# Patient Record
Sex: Male | Born: 1985 | Race: White | Hispanic: No | Smoking: Never smoker
Health system: Southern US, Community
[De-identification: ages and names within clinical notes are randomized; demographics above are authoritative.]

## PROBLEM LIST (undated history)

## (undated) DIAGNOSIS — E785 Hyperlipidemia, unspecified: Secondary | ICD-10-CM

## (undated) DIAGNOSIS — H548 Legal blindness, as defined in USA: Secondary | ICD-10-CM

## (undated) DIAGNOSIS — E1029 Type 1 diabetes mellitus with other diabetic kidney complication: Secondary | ICD-10-CM

## (undated) DIAGNOSIS — E10319 Type 1 diabetes mellitus with unspecified diabetic retinopathy without macular edema: Secondary | ICD-10-CM

## (undated) DIAGNOSIS — R809 Proteinuria, unspecified: Secondary | ICD-10-CM

## (undated) DIAGNOSIS — I10 Essential (primary) hypertension: Secondary | ICD-10-CM

## (undated) DIAGNOSIS — E109 Type 1 diabetes mellitus without complications: Secondary | ICD-10-CM

---

## 2009-03-20 DIAGNOSIS — A419 Sepsis, unspecified organism: Secondary | ICD-10-CM

## 2009-03-20 HISTORY — DX: Sepsis, unspecified organism: A41.9

## 2009-03-20 HISTORY — PX: DENTAL SURGERY: SHX609

## 2019-08-26 ENCOUNTER — Other Ambulatory Visit: Payer: Self-pay | Admitting: Orthopedic Surgery

## 2019-08-26 DIAGNOSIS — M25512 Pain in left shoulder: Secondary | ICD-10-CM

## 2019-09-09 ENCOUNTER — Ambulatory Visit
Admission: RE | Admit: 2019-09-09 | Discharge: 2019-09-09 | Disposition: A | Discharge status: Home / Self Care | Payer: Medicaid Other | Source: Ambulatory Visit | Attending: Orthopedic Surgery | Admitting: Orthopedic Surgery

## 2019-09-09 ENCOUNTER — Other Ambulatory Visit: Payer: Self-pay

## 2019-09-09 DIAGNOSIS — R937 Abnormal findings on diagnostic imaging of other parts of musculoskeletal system: Secondary | ICD-10-CM | POA: Insufficient documentation

## 2019-09-09 DIAGNOSIS — M25512 Pain in left shoulder: Secondary | ICD-10-CM

## 2019-09-09 MED ORDER — IOHEXOL 180 MG/ML  SOLN
20.0000 mL | Freq: Once | INTRAMUSCULAR | Status: AC
  Administered 2019-09-09: 5 mL via INTRA_ARTICULAR

## 2019-09-09 MED ORDER — SODIUM CHLORIDE (PF) 0.9 % IJ SOLN
20.0000 mL | Freq: Once | INTRAMUSCULAR | Status: AC
  Administered 2019-09-09: 7 mL

## 2019-09-09 MED ORDER — GADOBENATE DIMEGLUMINE 529 MG/ML IV SOLN
1.0000 mL | Freq: Once | INTRAVENOUS | Status: AC
  Administered 2019-09-09: 0.1 mL via INTRA_ARTICULAR

## 2019-09-09 MED ORDER — LIDOCAINE HCL (PF) 1 % IJ SOLN
10.0000 mL | Freq: Once | INTRAMUSCULAR | Status: AC
  Administered 2019-09-09: 10 mL
  Filled 2019-09-09: qty 10

## 2020-03-03 ENCOUNTER — Other Ambulatory Visit: Payer: Self-pay | Admitting: Nephrology

## 2020-03-03 DIAGNOSIS — N183 Chronic kidney disease, stage 3 unspecified: Secondary | ICD-10-CM

## 2020-03-03 DIAGNOSIS — I10 Essential (primary) hypertension: Secondary | ICD-10-CM

## 2020-03-03 DIAGNOSIS — R809 Proteinuria, unspecified: Secondary | ICD-10-CM

## 2020-03-11 ENCOUNTER — Other Ambulatory Visit: Payer: Self-pay

## 2020-03-11 ENCOUNTER — Ambulatory Visit
Admission: RE | Admit: 2020-03-11 | Discharge: 2020-03-11 | Disposition: A | Discharge status: Home / Self Care | Payer: Medicaid Other | Source: Ambulatory Visit | Attending: Nephrology | Admitting: Nephrology

## 2020-03-11 DIAGNOSIS — I10 Essential (primary) hypertension: Secondary | ICD-10-CM | POA: Insufficient documentation

## 2020-03-11 DIAGNOSIS — E1122 Type 2 diabetes mellitus with diabetic chronic kidney disease: Secondary | ICD-10-CM | POA: Diagnosis present

## 2020-03-11 DIAGNOSIS — R809 Proteinuria, unspecified: Secondary | ICD-10-CM | POA: Diagnosis present

## 2020-03-11 DIAGNOSIS — N183 Chronic kidney disease, stage 3 unspecified: Secondary | ICD-10-CM | POA: Diagnosis present

## 2020-05-21 ENCOUNTER — Other Ambulatory Visit: Payer: Self-pay | Admitting: Orthopedic Surgery

## 2020-05-21 ENCOUNTER — Encounter
Admission: RE | Admit: 2020-05-21 | Discharge: 2020-05-21 | Disposition: A | Discharge status: Home / Self Care | Payer: Medicaid Other | Source: Ambulatory Visit | Attending: Orthopedic Surgery | Admitting: Orthopedic Surgery

## 2020-05-21 ENCOUNTER — Other Ambulatory Visit: Payer: Self-pay

## 2020-05-21 HISTORY — DX: Proteinuria, unspecified: R80.9

## 2020-05-21 HISTORY — DX: Legal blindness, as defined in USA: H54.8

## 2020-05-21 HISTORY — DX: Essential (primary) hypertension: I10

## 2020-05-21 HISTORY — DX: Hyperlipidemia, unspecified: E78.5

## 2020-05-21 HISTORY — DX: Type 1 diabetes mellitus without complications: E10.9

## 2020-05-21 HISTORY — DX: Type 1 diabetes mellitus with unspecified diabetic retinopathy without macular edema: E10.319

## 2020-05-21 HISTORY — DX: Type 1 diabetes mellitus with other diabetic kidney complication: E10.29

## 2020-05-21 NOTE — Progress Notes (Signed)
Medical clearance form faxed to Dr. Sampson Goon per Dr. Pernell Dupre (anesthesia) request prior to surgery.

## 2020-05-21 NOTE — Patient Instructions (Addendum)
Your procedure is scheduled on:  Tuesday, March 15 Report to the Registration Desk on the 1st floor of the [facility] To find out your arrival time, please call [phone] between 1PM - 3PM on: Monday, March 14  REMEMBER: Instructions that are not followed completely may result in serious medical risk, up to and including death; or upon the discretion of your surgeon and anesthesiologist your surgery may need to be rescheduled.  Do not eat food after midnight the night before surgery.  No gum chewing, lozengers or hard candies.  You may however, drink water up to 2 hours before you are scheduled to arrive for your surgery. Do not drink anything within 2 hours of your scheduled arrival time.  DO NOT TAKE ANY MEDICATIONS THE MORNING OF SURGERY   Keep your insulin pump on until you are instructed otherwise on the day of surgery.  One week prior to surgery: STARTING MARCH 8 Stop aspirin, Anti-inflammatories (NSAIDS) such as Advil, Aleve, Ibuprofen, Motrin, Naproxen, Naprosyn and Aspirin based products such as Excedrin, Goodys Powder, BC Powder. Stop ANY OVER THE COUNTER supplements until after surgery. Stop Magnesium  No Alcohol for 24 hours before or after surgery.  No Smoking including e-cigarettes for 24 hours prior to surgery.  No chewable tobacco products for at least 6 hours prior to surgery.  No nicotine patches on the day of surgery.  Do not use any "recreational" drugs for at least a week prior to your surgery.  Please be advised that the combination of cocaine and anesthesia may have negative outcomes, up to and including death. If you test positive for cocaine, your surgery will be cancelled.  On the morning of surgery brush your teeth with toothpaste and water, you may rinse your mouth with mouthwash if you wish. Do not swallow any toothpaste or mouthwash.  Do not wear jewelry, make-up, hairpins, clips or nail polish.  Do not wear lotions, powders, or perfumes.    Do not shave body from the neck down 48 hours prior to surgery just in case you cut yourself which could leave a site for infection.  Also, freshly shaved skin may become irritated if using the CHG soap.  Contact lenses, hearing aids and dentures may not be worn into surgery.  Do not bring valuables to the hospital. [facility] is not responsible for any missing/lost belongings or valuables.   Use CHG Soap as directed on instruction sheet.  Notify your doctor if there is any change in your medical condition (cold, fever, infection).  Wear comfortable clothing (specific to your surgery type) to the hospital.  Plan for stool softeners for home use; pain medications have a tendency to cause constipation. You can also help prevent constipation by eating foods high in fiber such as fruits and vegetables and drinking plenty of fluids as your diet allows.  After surgery, you can help prevent lung complications by doing breathing exercises.  Take deep breaths and cough every 1-2 hours. Your doctor may order a device called an Incentive Spirometer to help you take deep breaths.  If you are being discharged the day of surgery, you will not be allowed to drive home. You will need a responsible adult (18 years or older) to drive you home and stay with you that night.   If you are taking public transportation, you will need to have a responsible adult (18 years or older) with you. Please confirm with your physician that it is acceptable to use public transportation.  Please call the Pre-admissions Testing Dept. at [phone] if you have any questions about these instructions.  Surgery Visitation Policy:  Patients undergoing a surgery or procedure may have one family member or support person with them as long as that person is not COVID-19 positive or experiencing its symptoms.  That person may remain in the waiting area during the procedure.

## 2020-05-24 ENCOUNTER — Other Ambulatory Visit: Payer: Self-pay

## 2020-05-24 ENCOUNTER — Encounter
Admission: RE | Admit: 2020-05-24 | Discharge: 2020-05-24 | Disposition: A | Discharge status: Home / Self Care | Payer: Medicaid Other | Source: Ambulatory Visit | Attending: Orthopedic Surgery | Admitting: Orthopedic Surgery

## 2020-05-24 DIAGNOSIS — Z01812 Encounter for preprocedural laboratory examination: Secondary | ICD-10-CM | POA: Diagnosis not present

## 2020-05-24 LAB — BASIC METABOLIC PANEL
Anion gap: 8 (ref 5–15)
BUN: 31 mg/dL — ABNORMAL HIGH (ref 6–20)
CO2: 25 mmol/L (ref 22–32)
Calcium: 9.1 mg/dL (ref 8.9–10.3)
Chloride: 104 mmol/L (ref 98–111)
Creatinine, Ser: 1.41 mg/dL — ABNORMAL HIGH (ref 0.61–1.24)
GFR, Estimated: >60 mL/min (ref >60)
Glucose, Bld: 296 mg/dL — ABNORMAL HIGH (ref 70–99)
Potassium: 5.5 mmol/L — ABNORMAL HIGH (ref 3.5–5.1)
Sodium: 137 mmol/L (ref 135–145)

## 2020-05-24 LAB — CBC WITH DIFFERENTIAL/PLATELET
Abs Immature Granulocytes: 0.05 10*3/uL (ref 0.00–0.07)
Basophils Absolute: 0 10*3/uL (ref 0.0–0.1)
Basophils Relative: 1 %
Eosinophils Absolute: 0.2 10*3/uL (ref 0.0–0.5)
Eosinophils Relative: 2 %
HCT: 37.6 % — ABNORMAL LOW (ref 39.0–52.0)
Hemoglobin: 13 g/dL (ref 13.0–17.0)
Immature Granulocytes: 1 %
Lymphocytes Relative: 15 %
Lymphs Abs: 1 10*3/uL (ref 0.7–4.0)
MCH: 29.6 pg (ref 26.0–34.0)
MCHC: 34.6 g/dL (ref 30.0–36.0)
MCV: 85.6 fL (ref 80.0–100.0)
Monocytes Absolute: 0.4 10*3/uL (ref 0.1–1.0)
Monocytes Relative: 6 %
Neutro Abs: 5.2 10*3/uL (ref 1.7–7.7)
Neutrophils Relative %: 75 %
Platelets: 348 10*3/uL (ref 150–400)
RBC: 4.39 MIL/uL (ref 4.22–5.81)
RDW: 12.3 % (ref 11.5–15.5)
WBC: 7 10*3/uL (ref 4.0–10.5)
nRBC: 0 % (ref 0.0–0.2)

## 2020-05-24 LAB — APTT: aPTT: 36 seconds (ref 24–36)

## 2020-05-24 LAB — PROTIME-INR
INR: 1 (ref 0.8–1.2)
Prothrombin Time: 12.4 seconds (ref 11.4–15.2)

## 2020-05-24 NOTE — Pre-Procedure Instructions (Signed)
Progress Notes - documented in this encounter  Lorain Childes, MD - 04/08/2020 3:00 PM EST Formatting of this note is different from the original. Images from the original note were not included. Follow Up Visit   Patient Name: Kenneth Rodgers, male  Patient DOB: [birth year] Date of Service: 04/08/2020  Patient MRN: [id number] Provider Creating Note: Lorain Childes, MD  [id number] Primary Care Physician:  [phone] [name] Additional Physicians/ Providers:   History of Present Illness Kenneth Rodgers is a 35 y.o. Kenneth Rodgers comes to the office for renal follow-up. 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 Renal indices are close to normal limits. He has been on losartan at 25 mg daily and has been tolerating well. Patient has been watching his diet and his hemoglobin A1c has been improving recently.  Medications   Current Outpatient Medications:  . ascorbic acid (VITAMIN C) 500 MG tablet, Take 1,000 mg by mouth daily, Disp: , Rfl:  . atorvastatin (LIPITOR) 10 MG tablet, atorvastatin 10 mg tablet, Disp: , Rfl:  . cetirizine (ZyrTEC) 10 MG tablet, Take 10 mg by mouth daily, Disp: , Rfl:  . gabapentin (NEURONTIN) 300 MG capsule, gabapentin 300 mg capsule, Disp: , Rfl:  . insulin glargine (Lantus SoloStar) 100 UNIT/ML injection, Lantus Solostar U-100 Insulin 100 unit/mL (3 mL) subcutaneous pen, Disp: , Rfl:  . Insulin Lispro, 1 Unit Dial, 100 UNIT/ML solution pen-injector, Humalog KwikPen (U-100) Insulin 100 unit/mL subcutaneous, Disp: , Rfl:  . losartan (COZAAR) 25 MG tablet, losartan 25 mg tablet, Disp: , Rfl:  . meloxicam (MOBIC) 15 MG tablet, , Disp: , Rfl:  . methocarbamol (ROBAXIN) 500 MG tablet, methocarbamol 500 mg tablet TAKE 1 TABLET 3 TIMES A DAY BY ORAL ROUTE FOR 30 DAYS., Disp: , Rfl:   Allergies Patient has no known allergies.  Problem List Patient Active Problem List  Diagnosis  . Diabetes mellitus without mention of  complication, type II or unspecified type, not stated as uncontrolled (HCC)  . Essential hypertension  . Nephrotic syndrome   Review of Systems  Constitutional: Negative.  HENT: Negative.  Eyes: Negative.  Respiratory: Negative.  Cardiovascular: Negative.  Gastrointestinal: Negative.  Genitourinary: Negative.  Musculoskeletal: Negative.  Skin: Negative.  Neurological: Negative.  Endo/Heme/Allergies: Negative.    History Past Medical History:  Diagnosis Date  . Diabetes mellitus without mention of complication, type II or unspecified type, not stated as uncontrolled (HCC)  . Essential hypertension  . Nephrotic syndrome  . Peripheral neuropathy   History reviewed. No pertinent surgical history. Family History  Problem Relation Age of Onset  . Hypertension Mother  . Hyperlipidemia Father  . Hypertension Father  . Cancer Father  . Diabetes Sister   Social History   Tobacco Use  . Smoking status: Never Smoker  . Smokeless tobacco: Never Used  Substance Use Topics  . Alcohol use: Never   Physical Exam  Vitals BP 150/100 Comment: MANUAL BP  Pulse 100  Temp 95.7 F  Wt 195 lb 3.2 oz (88.5 kg)  SpO2 100%  BMI 25.06 kg/m   PHYSICAL EXAM: General appearance: well developed, well nourished, NAD Eyes: anicteric sclerae, moist conjunctivae; no lid-lag  HENT: Atraumatic; hearing intact Neck: Trachea midline; supple Lungs: CTAB, with normal respiratory effort  CV: S1S2, no murmurs or rubs. Abdomen: Soft, non-tender; bowel sounds present Extremities: No peripheral edema Skin: Warm and dry, normal skin turgor, no rashes noted.  Laboratory Studies  Chemistry  Lab Units 03/03/20 1521  SODIUM mmol/L 136  POTASSIUM  mmol/L 5.0  CHLORIDE mmol/L 100  CO2 mmol/L 28  CALCIUM mg/dL 9.3  PHOSPHORUS mg/dL 3.2  PTH pg/mL 42  GLUCOSE mg/dL 809*  ALBUMIN % % 80  ALBUMIN, S/P g/dL 3.6  ALBUMIN g/dL 3.3*  BUN mg/dL 13  CREATININE mg/dL 9.83  EGFRNAFR JA/SNK/5.39J6 89   EGFRAFR mL/min/1.75m2 103   CBC  Lab Units 03/03/20 1521  WBC AUTO Thousand/uL 6.8  HEMOGLOBIN g/dL 73.4  HEMATOCRIT % 19.3  MCV fL 85.3  PLATELETS AUTO Thousand/uL 398   Urine  Lab Units 03/03/20 1521  PROT/CREAT RATIO UR mg/g creat 5.206*  5,206*   Lab Results  Component Value Date  PTH 42 03/03/2020  CALCIUM 9.3 03/03/2020  PHOS 3.2 03/03/2020    Imaging and Other Studies  [facility] - 03/12/2020 12:25 AM EST  CLINICAL DATA: Type 2 diabetes with stage III CKD, proteinuria  EXAM: RENAL / URINARY TRACT ULTRASOUND COMPLETE  COMPARISON: None.  FINDINGS: Right Kidney:  Renal measurements: 12.1 x 6.4 x 6.4 = volume: 258 mL. Diffusely increased renal cortical echogenicity. No concerning renal mass, shadowing calculus or hydronephrosis.  Left Kidney:  Renal measurements: 12.8 x 8.3 x 6.9 cm = volume: 382 mL. Diffusely increased renal cortical echogenicity. No concerning renal mass, shadowing calculus or hydronephrosis.  Bladder:  Appears normal for degree of bladder distention.  Other:  None.  IMPRESSION: Diffusely increased renal cortical echogenicity compatible medical renal disease.  Enlarged kidneys bilaterally is a nonspecific finding but can be seen in the setting of diabetic nephropathy.     Impression/Recommendations  Kenneth Rodgers is a 35 y.o. male with a past medical history of poorly controlled diabetes, hyperlipidemia, hypertension and nephrotic range proteinuria.  This is secondary to poorly controlled diabetes.  His renal sonogram shows chronicity with the diabetic changes.  The blood pressure in the office is a little bit elevated today and I advised him to increase the losartan to 50 mg daily and a prescription has been sent.  We will repeat the blood work and also urinalysis along with random urine protein creatinine ratio in about a month.  Dietary advice given to the patient again.  I advised him to avoid  nonsteroidal anti-inflammatory drugs.  I thank you for your confidence in referring this pleasant patient.  Return in about 8 weeks (around 06/03/2020).  Lorain Childes, MD [facility] Ph: [phone] Fax: [phone] 04/08/2020  Electronically signed by Lorain Childes, MD at 04/08/2020 3:41 PM EST  Plan of Treatment - documented as of this encounter  Upcoming Encounters Upcoming Encounters  Date Type Specialty Care Team Description  06/17/2020 Office Visit Nephrology Lorain Childes, MD  [address]  [place]  [place], [place] [address]  [phone] (Work)  [phone] (Fax)     Scheduled Orders Scheduled Orders  Name Type Priority Associated Diagnoses Order Schedule  Protein, Total, Random Urine w/Creatinine (Protein/Creat Ratio) Lab Routine Persistent proteinuria  Type 2 diabetes mellitus with diabetic chronic kidney disease (HCC)  Hypertension  Expected: 04/22/2020, Expires: 05/09/2021  Urinalysis Lab Routine Persistent proteinuria  Type 2 diabetes mellitus with diabetic chronic kidney disease (HCC)  Hypertension  Expected: 04/22/2020, Expires: 05/09/2021  Renal Function Panel Lab Routine Persistent proteinuria  Type 2 diabetes mellitus with diabetic chronic kidney disease (HCC)  Hypertension  Expected: 04/22/2020, Expires: 05/09/2021  CBC and Differential Lab Routine Persistent proteinuria  Type 2 diabetes mellitus with diabetic chronic kidney disease (HCC)  Hypertension  Expected: 04/22/2020, Expires: 05/09/2021   Visit Diagnoses - documented  in this encounter  Diagnosis  Hypertension - Primary   Persistent proteinuria   Type 2 diabetes mellitus with diabetic chronic kidney disease (HCC)   Nephrotic syndrome   Pure hypercholesterolemia, not otherwise specified    Discontinued Medications - documented as of this encounter  Medication Sig Discontinue Reason Start Date End Date  losartan (COZAAR) 25  MG tablet  losartan 25 mg tablet  12/19/2019 04/08/2020   Care Teams - documented as of this encounter  Team Member Relationship Specialty Start Date End Date  Clydie Braun, MD  [address]  [place], [place] [address]  [phone] (Work)  [phone] (Fax)  PCP - General Infectious Diseases 03/03/20    Images Patient Demographics  Patient Address Communication Language Race / Ethnicity Marital Status  [address] [facility]) [place], [place] [address]  Former (Nov. 02, 2021 - Nov. 01, 2021): [address] [facility] [place], [place] [address] [phone] [facility]) [email or web address] English (Preferred) White / Not Hispanic or Latino Divorced   Patient Contacts  Contact Name Contact Address Communication Relationship to Patient  [id number] Unknown [phone] [facility]) Other, Emergency Contact   Document Information  Primary Care Provider Other Service Providers Document Coverage Dates  [facility], MD (Dec. 15, 2021December 15, 2021 - Present) [phone] (Work) [phone] (Fax) [address] [place], [place] Infectious [facility] [address] [place], [place]  Jan. 20, 2022January 20, 2022   April 10, 2020  [facility] [phone]   Encounter Providers Encounter Date  [id number], MD (Attending) [phone] (Work) [phone] (Fax) [address] [place] [facility] Jan. 20, 2022January 20, 2022    Show All Sections

## 2020-05-24 NOTE — Pre-Procedure Instructions (Signed)
CALLED OVER TO DR Myrtis Hopping OFFICE REGARDING CLEARANCE.RECEPTIONIST CANNOT FIND WHERE THEY EVER RECEIVED CLEARANCE. SHE WILL RELAY MESSAGE TO NURSE AND SHE WILL CALL ME BACK REGARDING CLEARANCE

## 2020-05-25 NOTE — Pre-Procedure Instructions (Signed)
Called over to Dr Myrtis Hopping office and they did receive clearance from 3-4. Receptionist states that Dr Sampson Goon does want pt to come in office to be seen prior to 06-01-20 surgery. Dr Myrtis Hopping office setting this up

## 2020-05-31 ENCOUNTER — Other Ambulatory Visit
Admission: RE | Admit: 2020-05-31 | Discharge: 2020-05-31 | Disposition: A | Discharge status: Home / Self Care | Payer: Medicaid Other | Source: Ambulatory Visit | Attending: Orthopedic Surgery | Admitting: Orthopedic Surgery

## 2020-05-31 ENCOUNTER — Other Ambulatory Visit: Payer: Self-pay

## 2020-05-31 DIAGNOSIS — Z20822 Contact with and (suspected) exposure to covid-19: Secondary | ICD-10-CM | POA: Diagnosis not present

## 2020-05-31 DIAGNOSIS — Z01812 Encounter for preprocedural laboratory examination: Secondary | ICD-10-CM | POA: Diagnosis not present

## 2020-05-31 LAB — SARS CORONAVIRUS 2 (TAT 6-24 HRS): SARS Coronavirus 2: NEGATIVE

## 2020-05-31 MED ORDER — CHLORHEXIDINE GLUCONATE CLOTH 2 % EX PADS: 6.0000 | MEDICATED_PAD | Freq: Once | CUTANEOUS | Status: DC

## 2020-05-31 MED ORDER — CELECOXIB 200 MG PO CAPS: 200.0000 mg | ORAL_CAPSULE | ORAL | Status: AC

## 2020-05-31 MED ORDER — ACETAMINOPHEN 500 MG PO TABS: 1000.0000 mg | ORAL_TABLET | ORAL | Status: AC

## 2020-05-31 MED ORDER — SODIUM CHLORIDE 0.9 % IV SOLN: INTRAVENOUS | Status: DC

## 2020-05-31 MED ORDER — ORAL CARE MOUTH RINSE: 15.0000 mL | Freq: Once | OROMUCOSAL | Status: DC

## 2020-05-31 MED ORDER — CHLORHEXIDINE GLUCONATE 0.12 % MT SOLN: 15.0000 mL | Freq: Once | OROMUCOSAL | Status: DC

## 2020-05-31 MED ORDER — FAMOTIDINE 20 MG PO TABS: 20.0000 mg | ORAL_TABLET | Freq: Once | ORAL | Status: AC

## 2020-05-31 MED ORDER — CEFAZOLIN SODIUM-DEXTROSE 2-4 GM/100ML-% IV SOLN
2.0000 g | INTRAVENOUS | Status: AC
  Administered 2020-06-01: 2 g via INTRAVENOUS

## 2020-06-01 ENCOUNTER — Encounter
Admission: RE | Disposition: A | Discharge status: Home / Self Care | Payer: Self-pay | Source: Home / Self Care | Attending: Orthopedic Surgery

## 2020-06-01 ENCOUNTER — Ambulatory Visit: Payer: Medicaid Other | Admitting: Anesthesiology

## 2020-06-01 ENCOUNTER — Ambulatory Visit
Admission: RE | Admit: 2020-06-01 | Discharge: 2020-06-01 | Disposition: A | Discharge status: Home / Self Care | Payer: Medicaid Other | Attending: Orthopedic Surgery | Admitting: Orthopedic Surgery

## 2020-06-01 ENCOUNTER — Encounter: Payer: Self-pay | Admitting: Orthopedic Surgery

## 2020-06-01 ENCOUNTER — Ambulatory Visit: Payer: Medicaid Other

## 2020-06-01 DIAGNOSIS — Z79899 Other long term (current) drug therapy: Secondary | ICD-10-CM | POA: Diagnosis not present

## 2020-06-01 DIAGNOSIS — Z8619 Personal history of other infectious and parasitic diseases: Secondary | ICD-10-CM | POA: Insufficient documentation

## 2020-06-01 DIAGNOSIS — E10319 Type 1 diabetes mellitus with unspecified diabetic retinopathy without macular edema: Secondary | ICD-10-CM | POA: Insufficient documentation

## 2020-06-01 DIAGNOSIS — Z7982 Long term (current) use of aspirin: Secondary | ICD-10-CM | POA: Diagnosis not present

## 2020-06-01 DIAGNOSIS — G8929 Other chronic pain: Secondary | ICD-10-CM | POA: Insufficient documentation

## 2020-06-01 DIAGNOSIS — H548 Legal blindness, as defined in USA: Secondary | ICD-10-CM | POA: Insufficient documentation

## 2020-06-01 DIAGNOSIS — M7502 Adhesive capsulitis of left shoulder: Secondary | ICD-10-CM | POA: Insufficient documentation

## 2020-06-01 DIAGNOSIS — E785 Hyperlipidemia, unspecified: Secondary | ICD-10-CM | POA: Insufficient documentation

## 2020-06-01 DIAGNOSIS — I10 Essential (primary) hypertension: Secondary | ICD-10-CM | POA: Diagnosis not present

## 2020-06-01 DIAGNOSIS — Z794 Long term (current) use of insulin: Secondary | ICD-10-CM | POA: Diagnosis not present

## 2020-06-01 DIAGNOSIS — Z419 Encounter for procedure for purposes other than remedying health state, unspecified: Secondary | ICD-10-CM

## 2020-06-01 HISTORY — PX: SHOULDER ARTHROSCOPY WITH LABRAL REPAIR: SHX5691

## 2020-06-01 LAB — POTASSIUM: Potassium: 4.5 mmol/L (ref 3.5–5.1)

## 2020-06-01 LAB — POCT I-STAT, CHEM 8
BUN: 29 mg/dL — ABNORMAL HIGH (ref 6–20)
Calcium, Ion: 1.11 mmol/L — ABNORMAL LOW (ref 1.15–1.40)
Chloride: 105 mmol/L (ref 98–111)
Creatinine, Ser: 1.2 mg/dL (ref 0.61–1.24)
Glucose, Bld: 146 mg/dL — ABNORMAL HIGH (ref 70–99)
HCT: 31 % — ABNORMAL LOW (ref 39.0–52.0)
Hemoglobin: 10.5 g/dL — ABNORMAL LOW (ref 13.0–17.0)
Potassium: 5.5 mmol/L — ABNORMAL HIGH (ref 3.5–5.1)
Sodium: 139 mmol/L (ref 135–145)
TCO2: 25 mmol/L (ref 22–32)

## 2020-06-01 LAB — GLUCOSE, CAPILLARY: Glucose-Capillary: 101 mg/dL — ABNORMAL HIGH (ref 70–99)

## 2020-06-01 SURGERY — ARTHROSCOPY, SHOULDER, WITH GLENOID LABRUM REPAIR
Anesthesia: General | Site: Shoulder | Laterality: Left

## 2020-06-01 MED ORDER — LIDOCAINE HCL (PF) 1 % IJ SOLN
INTRAMUSCULAR | Status: DC | PRN
  Administered 2020-06-01: .8 mL via SUBCUTANEOUS

## 2020-06-01 MED ORDER — MIDAZOLAM HCL 2 MG/2ML IJ SOLN
INTRAMUSCULAR | Status: AC
  Administered 2020-06-01: 1 mg via INTRAVENOUS
  Filled 2020-06-01: qty 2

## 2020-06-01 MED ORDER — ROCURONIUM BROMIDE 100 MG/10ML IV SOLN
INTRAVENOUS | Status: DC | PRN
  Administered 2020-06-01: 55 mg via INTRAVENOUS

## 2020-06-01 MED ORDER — FENTANYL CITRATE (PF) 100 MCG/2ML IJ SOLN: 25.0000 ug | INTRAMUSCULAR | Status: DC | PRN

## 2020-06-01 MED ORDER — ACETAMINOPHEN 500 MG PO TABS
ORAL_TABLET | ORAL | Status: AC
  Administered 2020-06-01: 1000 mg via ORAL
  Filled 2020-06-01: qty 2

## 2020-06-01 MED ORDER — OXYCODONE HCL 5 MG PO TABS: 5.0000 mg | ORAL_TABLET | ORAL | 0 refills | Status: DC | PRN

## 2020-06-01 MED ORDER — FENTANYL CITRATE (PF) 100 MCG/2ML IJ SOLN
INTRAMUSCULAR | Status: AC
  Administered 2020-06-01: 50 ug via INTRAVENOUS
  Filled 2020-06-01: qty 2

## 2020-06-01 MED ORDER — PROPOFOL 10 MG/ML IV BOLUS
INTRAVENOUS | Status: DC | PRN
  Administered 2020-06-01: 150 mg via INTRAVENOUS

## 2020-06-01 MED ORDER — LACTATED RINGERS IV SOLN
INTRAVENOUS | Status: DC | PRN
  Administered 2020-06-01 (×4): 3001 mL

## 2020-06-01 MED ORDER — LIDOCAINE HCL (PF) 1 % IJ SOLN
INTRAMUSCULAR | Status: AC
  Filled 2020-06-01: qty 5

## 2020-06-01 MED ORDER — PHENYLEPHRINE HCL (PRESSORS) 10 MG/ML IV SOLN
INTRAVENOUS | Status: DC | PRN
  Administered 2020-06-01 (×2): 150 ug via INTRAVENOUS
  Administered 2020-06-01 (×4): 100 ug via INTRAVENOUS

## 2020-06-01 MED ORDER — FAMOTIDINE 20 MG PO TABS
ORAL_TABLET | ORAL | Status: AC
  Administered 2020-06-01: 20 mg via ORAL
  Filled 2020-06-01: qty 1

## 2020-06-01 MED ORDER — ONDANSETRON HCL 4 MG/2ML IJ SOLN
INTRAMUSCULAR | Status: DC | PRN
  Administered 2020-06-01: 4 mg via INTRAVENOUS

## 2020-06-01 MED ORDER — PROPOFOL 10 MG/ML IV BOLUS
INTRAVENOUS | Status: AC
  Filled 2020-06-01: qty 20

## 2020-06-01 MED ORDER — ONDANSETRON HCL 4 MG/2ML IJ SOLN: 4.0000 mg | Freq: Once | INTRAMUSCULAR | Status: DC | PRN

## 2020-06-01 MED ORDER — MIDAZOLAM HCL 2 MG/2ML IJ SOLN
INTRAMUSCULAR | Status: AC
  Filled 2020-06-01: qty 2

## 2020-06-01 MED ORDER — MIDAZOLAM HCL 2 MG/2ML IJ SOLN: 1.0000 mg | Freq: Once | INTRAMUSCULAR | Status: AC

## 2020-06-01 MED ORDER — BUPIVACAINE HCL (PF) 0.5 % IJ SOLN
INTRAMUSCULAR | Status: AC
  Filled 2020-06-01: qty 10

## 2020-06-01 MED ORDER — EPINEPHRINE PF 1 MG/ML IJ SOLN
INTRAMUSCULAR | Status: AC
  Filled 2020-06-01: qty 1

## 2020-06-01 MED ORDER — BUPIVACAINE HCL (PF) 0.5 % IJ SOLN
INTRAMUSCULAR | Status: DC | PRN
  Administered 2020-06-01: 10 mL

## 2020-06-01 MED ORDER — BUPIVACAINE LIPOSOME 1.3 % IJ SUSP
INTRAMUSCULAR | Status: DC | PRN
  Administered 2020-06-01: 20 mL via PERINEURAL

## 2020-06-01 MED ORDER — FENTANYL CITRATE (PF) 100 MCG/2ML IJ SOLN
INTRAMUSCULAR | Status: DC | PRN
  Administered 2020-06-01 (×2): 50 ug via INTRAVENOUS

## 2020-06-01 MED ORDER — CELECOXIB 200 MG PO CAPS
ORAL_CAPSULE | ORAL | Status: AC
  Administered 2020-06-01: 200 mg via ORAL
  Filled 2020-06-01: qty 1

## 2020-06-01 MED ORDER — CEFAZOLIN SODIUM-DEXTROSE 2-4 GM/100ML-% IV SOLN
INTRAVENOUS | Status: AC
  Filled 2020-06-01: qty 100

## 2020-06-01 MED ORDER — EPINEPHRINE PF 1 MG/ML IJ SOLN
INTRAMUSCULAR | Status: AC
  Filled 2020-06-01: qty 4

## 2020-06-01 MED ORDER — SODIUM CHLORIDE 0.9 % IV SOLN
INTRAVENOUS | Status: DC | PRN
  Administered 2020-06-01: 10 mL/h via INTRAVENOUS

## 2020-06-01 MED ORDER — BUPIVACAINE LIPOSOME 1.3 % IJ SUSP
INTRAMUSCULAR | Status: AC
  Filled 2020-06-01: qty 20

## 2020-06-01 MED ORDER — LIDOCAINE HCL (CARDIAC) PF 100 MG/5ML IV SOSY
PREFILLED_SYRINGE | INTRAVENOUS | Status: DC | PRN
  Administered 2020-06-01: 60 mg via INTRAVENOUS

## 2020-06-01 MED ORDER — MIDAZOLAM HCL 2 MG/2ML IJ SOLN
INTRAMUSCULAR | Status: DC | PRN
  Administered 2020-06-01: 1 mg via INTRAVENOUS

## 2020-06-01 MED ORDER — SUGAMMADEX SODIUM 200 MG/2ML IV SOLN
INTRAVENOUS | Status: DC | PRN
  Administered 2020-06-01: 200 mg via INTRAVENOUS

## 2020-06-01 MED ORDER — FENTANYL CITRATE (PF) 100 MCG/2ML IJ SOLN: 50.0000 ug | Freq: Once | INTRAMUSCULAR | Status: AC

## 2020-06-01 MED ORDER — ONDANSETRON HCL 4 MG PO TABS: 4.0000 mg | ORAL_TABLET | Freq: Three times a day (TID) | ORAL | 0 refills | Status: DC | PRN

## 2020-06-01 MED ORDER — FENTANYL CITRATE (PF) 100 MCG/2ML IJ SOLN
INTRAMUSCULAR | Status: AC
  Filled 2020-06-01: qty 2

## 2020-06-01 SURGICAL SUPPLY — 61 items
ADAPTER IRRIG TUBE 2 SPIKE SOL (ADAPTER) ×4 IMPLANT
ADPR TBG 2 SPK PMP STRL ASCP (ADAPTER) ×2
BUR RADIUS 4.0X18.5 (BURR) ×2 IMPLANT
BUR RADIUS 5.5 (BURR) ×2 IMPLANT
CANISTER SUCT LVC 12 LTR MEDI- (MISCELLANEOUS) IMPLANT
CANNULA 5.75X7 CRYSTAL CLEAR (CANNULA) ×4 IMPLANT
CANNULA PARTIAL THREAD 2X7 (CANNULA) ×2 IMPLANT
CANNULA TWIST IN 8.25X9CM (CANNULA) IMPLANT
COOLER POLAR GLACIER W/PUMP (MISCELLANEOUS) ×2 IMPLANT
COVER WAND RF STERILE (DRAPES) ×2 IMPLANT
DEVICE SUCT BLK HOLE OR FLOOR (MISCELLANEOUS) IMPLANT
DRAPE 3/4 80X56 (DRAPES) ×2 IMPLANT
DRAPE IMP U-DRAPE 54X76 (DRAPES) ×4 IMPLANT
DRAPE INCISE IOBAN 66X45 STRL (DRAPES) ×2 IMPLANT
DRAPE U-SHAPE 47X51 STRL (DRAPES) IMPLANT
DRSG AQUACEL AG 3.5X4 (GAUZE/BANDAGES/DRESSINGS) ×6 IMPLANT
DURAPREP 26ML APPLICATOR (WOUND CARE) ×6 IMPLANT
ELECT REM PT RETURN 9FT ADLT (ELECTROSURGICAL)
ELECTRODE REM PT RTRN 9FT ADLT (ELECTROSURGICAL) IMPLANT
GAUZE SPONGE 4X4 12PLY STRL (GAUZE/BANDAGES/DRESSINGS) ×2 IMPLANT
GAUZE XEROFORM 1X8 LF (GAUZE/BANDAGES/DRESSINGS) ×2 IMPLANT
GLOVE SURG 9.0 ORTHO LTXF (GLOVE) ×4 IMPLANT
GLOVE SURG UNDER POLY LF SZ9 (GLOVE) ×2 IMPLANT
GOWN STRL REUS TWL 2XL XL LVL4 (GOWN DISPOSABLE) ×2 IMPLANT
GOWN STRL REUS W/ TWL LRG LVL3 (GOWN DISPOSABLE) ×1 IMPLANT
GOWN STRL REUS W/ TWL LRG LVL4 (GOWN DISPOSABLE) ×1 IMPLANT
GOWN STRL REUS W/TWL LRG LVL3 (GOWN DISPOSABLE) ×2
GOWN STRL REUS W/TWL LRG LVL4 (GOWN DISPOSABLE) ×2
IV LACTATED RINGER IRRG 3000ML (IV SOLUTION) ×12
IV LR IRRIG 3000ML ARTHROMATIC (IV SOLUTION) ×6 IMPLANT
KIT STABILIZATION SHOULDER (MISCELLANEOUS) ×2 IMPLANT
KIT SUTURETAK 3.0 INSERT PERC (KITS) ×2 IMPLANT
KIT TURNOVER KIT A (KITS) ×2 IMPLANT
MANIFOLD NEPTUNE II (INSTRUMENTS) ×4 IMPLANT
MASK FACE SPIDER DISP (MASK) ×2 IMPLANT
MAT ABSORB  FLUID 56X50 GRAY (MISCELLANEOUS) ×2
MAT ABSORB FLUID 56X50 GRAY (MISCELLANEOUS) ×2 IMPLANT
NEEDLE HYPO 22GX1.5 SAFETY (NEEDLE) ×2 IMPLANT
PACK ARTHROSCOPY SHOULDER (MISCELLANEOUS) ×2 IMPLANT
PAD ARMBOARD 7.5X6 YLW CONV (MISCELLANEOUS) ×2 IMPLANT
PAD WRAPON POLAR SHDR XLG (MISCELLANEOUS) ×1 IMPLANT
SET TUBE SUCT SHAVER OUTFL 24K (TUBING) ×2 IMPLANT
SET TUBE TIP INTRA-ARTICULAR (MISCELLANEOUS) ×2 IMPLANT
STRAP SAFETY 5IN WIDE (MISCELLANEOUS) ×2 IMPLANT
STRIP CLOSURE SKIN 1/2X4 (GAUZE/BANDAGES/DRESSINGS) ×2 IMPLANT
SUT ETHILON 4-0 (SUTURE) ×2
SUT ETHILON 4-0 FS2 18XMFL BLK (SUTURE) ×1
SUT LASSO 90 DEG SD STR (SUTURE) ×2 IMPLANT
SUT MNCRL 4-0 (SUTURE) ×2
SUT MNCRL 4-0 27XMFL (SUTURE) ×1
SUT PDS AB 0 CT1 27 (SUTURE) IMPLANT
SUT ULTRABRAID 2 COBRAID 38 (SUTURE) IMPLANT
SUT VIC AB 0 CT1 36 (SUTURE) IMPLANT
SUT VIC AB 2-0 CT2 27 (SUTURE) IMPLANT
SUTURE ETHLN 4-0 FS2 18XMF BLK (SUTURE) ×1 IMPLANT
SUTURE MNCRL 4-0 27XMF (SUTURE) ×1 IMPLANT
TAPE MICROFOAM 4IN (TAPE) ×2 IMPLANT
TUBING ARTHRO INFLOW-ONLY STRL (TUBING) ×2 IMPLANT
TUBING CONNECTING 10 (TUBING) ×2 IMPLANT
WAND HAND CNTRL MULTIVAC 90 (MISCELLANEOUS) ×2 IMPLANT
WRAPON POLAR PAD SHDR XLG (MISCELLANEOUS) ×2

## 2020-06-01 NOTE — Anesthesia Postprocedure Evaluation (Signed)
Anesthesia Post Note  Patient: Kenneth Rodgers  Procedure(s) Performed: LEFT SHOULDER ARTHROSCOPY WITH LABRAL REPAIR AND SUBACCROMINAL DECOMPRESSION (Left Shoulder)  Patient location during evaluation: PACU Anesthesia Type: General Level of consciousness: awake and alert Pain management: pain level controlled Vital Signs Assessment: post-procedure vital signs reviewed and stable Respiratory status: spontaneous breathing and respiratory function stable Cardiovascular status: stable Anesthetic complications: no   No complications documented.   Last Vitals:  Vitals:   06/01/20 1430 06/01/20 1450  BP: (!) 154/94 (!) 154/85  Pulse: 87 85  Resp: 14 18  Temp: 36.7 C 36.6 C  SpO2: 98% 100%    Last Pain:  Vitals:   06/01/20 1450  TempSrc: Temporal  PainSc: 0-No pain                 Mael Delap K

## 2020-06-01 NOTE — Transfer of Care (Signed)
Immediate Anesthesia Transfer of Care Note  Patient: Kenneth Rodgers  Procedure(s) Performed: LEFT SHOULDER ARTHROSCOPY WITH LABRAL REPAIR AND SUBACCROMINAL DECOMPRESSION (Left Shoulder)  Patient Location: PACU  Anesthesia Type:General  Level of Consciousness: awake, alert  and oriented  Airway & Oxygen Therapy: Patient Spontanous Breathing and Patient connected to face mask oxygen  Post-op Assessment: Report given to RN and Post -op Vital signs reviewed and stable  Post vital signs: Reviewed and stable  Last Vitals:  Vitals Value Taken Time  BP 168/99 06/01/20 1346  Temp 36.2 C 06/01/20 1346  Pulse 93 06/01/20 1353  Resp 20 06/01/20 1353  SpO2 100 % 06/01/20 1353  Vitals shown include unvalidated device data.  Last Pain:  Vitals:   06/01/20 0932  TempSrc: Temporal  PainSc: 0-No pain         Complications: No complications documented.

## 2020-06-01 NOTE — Anesthesia Procedure Notes (Signed)
Anesthesia Regional Block: Interscalene brachial plexus block   Pre-Anesthetic Checklist: ,, timeout performed, Correct Patient, Correct Site, Correct Laterality, Correct Procedure, Correct Position, site marked, Risks and benefits discussed,  Surgical consent,  Pre-op evaluation,  At surgeon's request and post-op pain management  Laterality: Upper and Left  Prep: chloraprep       Needles:  Injection technique: Single-shot  Needle Type: Echogenic Stimulator Needle     Needle Length: 10cm  Needle Gauge: 21     Additional Needles:   Procedures: Doppler guided,,,, ultrasound used (permanent image in chart),,,,   Nerve Stimulator or Paresthesia:  Response: 0.5 mA,   Additional Responses:   Narrative:  Start time: 06/01/2020 11:34 AM End time: 06/01/2020 11:37 AM Injection made incrementally with aspirations every 5 mL.  Performed by: Personally   Additional Notes: Functioning IV was confirmed and O2 Yaak/monitors were applied. Light sedation administered as required, patient responsive throughout. A 21ga EchoStim needle was used.  Negative aspiration and negative test dose prior to incremental administration of local anesthetic. 1% Lidocaine for skin wheal,  ml. Total LA: 6ml - Exparel 22ml & 0.5% Bupivicaine 35ml. U/S images stored in chart. The patient tolerated the procedure well.

## 2020-06-01 NOTE — Anesthesia Procedure Notes (Signed)
Procedure Name: Intubation Date/Time: 06/01/2020 12:57 PM Performed by: Henrietta Hoover, CRNA Pre-anesthesia Checklist: Patient identified, Patient being monitored, Timeout performed, Emergency Drugs available and Suction available Patient Re-evaluated:Patient Re-evaluated prior to induction Oxygen Delivery Method: Circle system utilized Preoxygenation: Pre-oxygenation with 100% oxygen Induction Type: IV induction Ventilation: Mask ventilation without difficulty Laryngoscope Size: 3, McGraph and 4 Grade View: Grade I Tube type: Oral Tube size: 7.5 mm Number of attempts: 1 Airway Equipment and Method: Stylet Placement Confirmation: ETT inserted through vocal cords under direct vision,  positive ETCO2 and breath sounds checked- equal and bilateral Secured at: 23 cm Tube secured with: Tape Dental Injury: Teeth and Oropharynx as per pre-operative assessment

## 2020-06-01 NOTE — Discharge Instructions (Signed)
AMBULATORY SURGERY  DISCHARGE INSTRUCTIONS   1) The drugs that you were given will stay in your system until tomorrow so for the next 24 hours you should not:  A) Drive an automobile B) Make any legal decisions C) Drink any alcoholic beverage   2) You may resume regular meals tomorrow.  Today it is better to start with liquids and gradually work up to solid foods.  You may eat anything you prefer, but it is better to start with liquids, then soup and crackers, and gradually work up to solid foods.   3) Please notify your doctor immediately if you have any unusual bleeding, trouble breathing, redness and pain at the surgery site, drainage, fever, or pain not relieved by medication.    4) Additional Instructions:        Please contact your physician with any problems or Same Day Surgery at [phone], Monday through Friday 6 am to 4 pm, or [place] [facility] [place] Main number at [phone].  Bupivacaine Liposomal Suspension for Injection What is this medicine? BUPIVACAINE LIPOSOMAL (bue PIV a kane LIP oh som al) is an anesthetic. It causes loss of feeling in the skin or other tissues. It is used to prevent and to treat pain from some procedures. This medicine may be used for other purposes; ask your health care provider or pharmacist if you have questions. COMMON BRAND NAME(S): EXPAREL What should I tell my health care provider before I take this medicine? They need to know if you have any of these conditions:  G6PD deficiency  heart disease  kidney disease  liver disease  low blood pressure  lung or breathing disease, like asthma  an unusual or allergic reaction to bupivacaine, other medicines, foods, dyes, or preservatives  pregnant or trying to get pregnant  breast-feeding How should I use this medicine? This medicine is injected into the affected area. It is given by a health care provider in a hospital or clinic setting. Talk to your health care provider  about the use of this medicine in children. While it may be given to children as young as 6 years for selected conditions, precautions do apply. Overdosage: If you think you have taken too much of this medicine contact a poison control center or emergency room at once. NOTE: This medicine is only for you. Do not share this medicine with others. What if I miss a dose? This does not apply. What may interact with this medicine? This medicine may interact with the following medications:  acetaminophen  certain antibiotics like dapsone, nitrofurantoin, aminosalicylic acid, sulfonamides  certain medicines for seizures like phenobarbital, phenytoin, valproic acid  chloroquine  cyclophosphamide  flutamide  hydroxyurea  ifosfamide  metoclopramide  nitric oxide  nitroglycerin  nitroprusside  nitrous oxide  other local anesthetics like lidocaine, pramoxine, tetracaine  primaquine  quinine  rasburicase  sulfasalazine This list may not describe all possible interactions. Give your health care provider a list of all the medicines, herbs, non-prescription drugs, or dietary supplements you use. Also tell them if you smoke, drink alcohol, or use illegal drugs. Some items may interact with your medicine. What should I watch for while using this medicine? Your condition will be monitored carefully while you are receiving this medicine. Be careful to avoid injury while the area is numb, and you are not aware of pain. What side effects may I notice from receiving this medicine? Side effects that you should report to your doctor or health care professional as soon as possible:    allergic reactions like skin rash, itching or hives, swelling of the face, lips, or tongue  seizures  signs and symptoms of a dangerous change in heartbeat or heart rhythm like chest pain; dizziness; fast, irregular heartbeat; palpitations; feeling faint or lightheaded; falls; breathing problems  signs and  symptoms of methemoglobinemia such as pale, gray, or blue colored skin; headache; fast heartbeat; shortness of breath; feeling faint or lightheaded, falls; tiredness Side effects that usually do not require medical attention (report to your doctor or health care professional if they continue or are bothersome):  anxious  back pain  changes in taste  changes in vision  constipation  dizziness  fever  nausea, vomiting This list may not describe all possible side effects. Call your doctor for medical advice about side effects. You may report side effects to FDA at 1-800-FDA-1088. Where should I keep my medicine? This drug is given in a hospital or clinic and will not be stored at home. NOTE: This sheet is a summary. It may not cover all possible information. If you have questions about this medicine, talk to your doctor, pharmacist, or health care provider.  2021 Elsevier/Gold Standard (2019-06-12 12:24:57)  

## 2020-06-01 NOTE — Op Note (Signed)
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 male with a diagnosis of chronic left shoulder pain who failed conservative measures and elected for surgical management.    I discussed the risks and benefits of surgery. The risks include but are not limited to infection, bleeding requiring blood transfusion, nerve or blood vessel injury, joint stiffness or loss of motion, persistent pain, weakness or instability, malunion, nonunion and hardware failure and the need for further surgery. Medical risks include but are not limited to DVT and pulmonary embolism, myocardial infarction, stroke, pneumonia, respiratory failure and death. Patient understood these risks and wished to proceed.   OPERATIVE FINDINGS: The left shoulder rotator cuff and biceps tendon were intact.  There were no focal chondral lesions.  Patient had no evidence of a labral tear or SLAP tear.  Patient had capsular thickening and adhesions within the rotator interval anteriorly.  Patient had a severe subacromial bursitis with subacromial spurring.  OPERATIVE PROCEDURE: The patient was met in the preoperative area. The left shoulder was signed with the word yes and my initials according the hospital's correct site of surgery protocol.  Preop history and physical was performed at the bedside.  Patient underwent an interscalene block with Exparel by the anesthesia service in the preop area.  Patient was brought to the operating room where he underwent  general anesthesia. The patient was placed in a beachchair position.  A spider arm positioner was used for  this case. Examination under anesthesia revealed no limitation of motion or instability with load shift testing. The patient had a negative sulcus sign.  Patient had limitation to shoulder extension and external rotation in 90 degrees of abduction.  Patient was prepped and draped in a sterile fashion. A timeout was performed to verify the patient's name, date of birth, medical record number, correct site of surgery and correct procedure to be performed there was also used to verify the patient received antibiotics that all appropriate instruments and radiographs studies were available in the room. Once all in attendance were in agreement case began.  Bony landmarks were drawn out with a surgical marker along with proposed arthroscopy incisions. These were pre-injected with 1% lidocaine plain. An 11 blade was used to establish a posterior portal through which the arthroscope was placed in the glenohumeral joint. A full diagnostic examination of the shoulder was performed. There was no tear of the supraspinatus identified.  Patient was found to have a thickened anterior capsule and adhesions and scar tissue in the rotator interval.  A lysis of adhesions was performed with a 90 degree ArthroCare wand.  A capsulotomy of the anterior capsule was also performed using a 90 degree ArthroCare wand.  There was no tear of the subscapularis.  The arthroscope was then placed in the subacromial space.  Extensive bursitis was encountered. A lateral portal was established with an 18-gauge spinal needle for localization. A 90 ArthroCare wand and 40 resector shaver blade were used to form an extensive subdeltoid and subacromial bursectomy. A subacromial decompression was performed using a 5.5 mm resector shaver blade from the lateral portal.  Subacromial space was then copiously irrigated to remove all osseous debris.  Final arthroscopic images were taken. Arthroscopic instruments were then removed.  Skin closure for the  arthroscopic incisions was performed with 4-0 nylon.  A dry sterile dressing was applied.  The patient was placed in a sling and  a Polar Care sleeve.  All sharp and it instrument counts were correct at the conclusion of the case. I was scrubbed and present for the entire case. I spoke with the patient's mother postoperatively to let her know the case had been performed without complication and the patient was stable in recovery room.

## 2020-06-01 NOTE — H&P (Signed)
PREOPERATIVE H&P  Chief Complaint: Chronic posterior left shoulder pain  HPI: Kenneth Rodgers is a 35 y.o. male type 1 diabetes who presents for preoperative history and physical with a diagnosis of chronic posterior pain.  Symptoms of pain and limited range of motion are significantly impairing activities of daily living.  Patient had an MRI which did not show internal derangement, but he has failed nonoperative management.   He wished to proceed with arthroscopic evaluation of the left shoulder with possible capsulotomy for capsular thickening seen on the MRI and subacromial decompression.    Past Medical History:  Diagnosis Date  . Diabetes type I (HCC)    started on Tandem insulin pump 04/2020; has Dexcom  . Diabetic retinopathy associated with type 1 diabetes mellitus (HCC)   . Hyperlipidemia   . Hypertension   . Legally blind   . 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 Sepsis (HCC) 2011   s/p tooth extraction and infected jaw   Past Surgical History:  Procedure Laterality Date  . DENTAL SURGERY  2011   jaw infection (sepsis) from tooth extraction   Social History   Socioeconomic History  . Marital status: Divorced    Spouse name: Not on file  . Number of children: 4  . Years of education: Not on file  . Highest education level: Not on file  Occupational History  . Not on file  Tobacco Use  . Smoking status: Never Smoker  . Smokeless tobacco: Never Used  Vaping Use  . Vaping Use: Never used  Substance and Sexual Activity  . Alcohol use: Not Currently  . Drug use: Never  . Sexual activity: Not on file  Other Topics Concern  . Not on file  Social History Narrative  . Not on file   Social Determinants of Health   Financial Resource Strain: Not on file  Food Insecurity: Not on file  Transportation Needs: Not on file  Physical Activity: Not on file  Stress: Not on file  Social Connections: Not on file   Family History  Problem Relation Age of Onset   . Pancreatic cancer Father    No Known Allergies Prior to Admission medications   Medication Sig Start Date End Date Taking? Authorizing Provider  aspirin EC 81 MG tablet Take 81 mg by mouth daily. Swallow whole.   Yes [provider]  atorvastatin (LIPITOR) 10 MG tablet Take 10 mg by mouth daily. 03/06/20  Yes [provider]  cetirizine (ZYRTEC) 10 MG tablet Take 10 mg by mouth daily.   Yes [provider]  fluticasone (FLONASE) 50 MCG/ACT nasal spray Place 2 sprays into both nostrils daily as needed for allergies or rhinitis.   Yes [provider]  HUMALOG 100 UNIT/ML injection Inject 80 Units into the skin daily. Via Pump 05/03/20  Yes [provider]  losartan (COZAAR) 50 MG tablet Take 100 mg by mouth daily. 04/08/20  Yes [provider]  magnesium gluconate (MAGONATE) 500 MG tablet Take 500 mg by mouth daily.   Yes [provider]  Multiple Vitamin (MULTIVITAMIN WITH MINERALS) TABS tablet Take 1 tablet by mouth daily.   Yes [provider]     Positive ROS: All other systems have been reviewed and were otherwise negative with the exception of those mentioned in the HPI and as above.  Physical Exam: General: Alert, no acute distress Cardiovascular: Regular rate and rhythm, no murmurs rubs or gallops.  No pedal edema Respiratory: Clear  to auscultation bilaterally, no wheezes rales or rhonchi. No cyanosis, no use of accessory musculature GI: No organomegaly, abdomen is soft and non-tender nondistended with positive bowel sounds. Skin: Skin intact, no lesions within the operative field. Neurologic: Sensation intact distally Psychiatric: Patient is competent for consent with normal mood and affect Lymphatic: No cervical lymphadenopathy  MUSCULOSKELETAL: Left shoulder: Patient can forward elevate and abduct to proximal 150 degrees.  He lacks approximately 20 degrees of full extension to the shoulder.  Patient can  externally rotate flex to 85 degrees with the shoulder abducted 90 degrees and can internally rotate approximately 50 degrees in the sitting position.  He demonstrates no weakness of rotator cuff medial strength testing.  He has full digital wrist and elbow range of motion, intact sensation to light touch and a palpable radial pulse.  Assessment: Left Shoulder Arthroscopic Labral Repair and SubAccrominal Decompression  Plan: Plan for Procedure(s): LEFT SHOULDER ARTHROSCOPY WITH POSSIBLE CAPSULOTOMY AND SUBACCROMINAL DECOMPRESSION  I discussed the details of the operation as well as postoperative course with the patient.  Preop history and physical was performed at the bedside this morning.  I marked the left shoulder according the hospital's correct site of surgery protocol.  I discussed the risks and benefits of surgery. The risks include but are not limited to infection, bleeding, nerve or blood vessel injury, joint stiffness or loss of motion, persistent pain, weakness or instability and the need for further surgery. Medical risks include but are not limited to DVT and pulmonary embolism, myocardial infarction, stroke, pneumonia, respiratory failure and death. Patient understood these risks and wished to proceed.    Juanell Fairly, MD   06/01/2020 11:35 AM

## 2020-06-01 NOTE — Anesthesia Preprocedure Evaluation (Addendum)
Anesthesia Evaluation  Patient identified by MRN, date of birth, ID band Patient awake    Reviewed: Allergy & Precautions, NPO status , Patient's Chart, lab work & pertinent test results  History of Anesthesia Complications Negative for: history of anesthetic complications  Airway Mallampati: II       Dental   Pulmonary neg sleep apnea, neg COPD, Not current smoker,           Cardiovascular hypertension, Pt. on medications (-) Past MI and (-) CHF (-) dysrhythmias (-) Valvular Problems/Murmurs     Neuro/Psych neg Seizures    GI/Hepatic Neg liver ROS, neg GERD  ,  Endo/Other  diabetes, Type 1, Insulin Dependent  Renal/GU Renal InsufficiencyRenal disease     Musculoskeletal   Abdominal   Peds  Hematology   Anesthesia Other Findings   Reproductive/Obstetrics                            Anesthesia Physical Anesthesia Plan  ASA: III  Anesthesia Plan: General   Post-op Pain Management:  Regional for Post-op pain   Induction: Intravenous  PONV Risk Score and Plan: 2 and Ondansetron and Midazolam  Airway Management Planned: Oral ETT  Additional Equipment:   Intra-op Plan:   Post-operative Plan:   Informed Consent: I have reviewed the patients History and Physical, chart, labs and discussed the procedure including the risks, benefits and alternatives for the proposed anesthesia with the patient or authorized representative who has indicated his/her understanding and acceptance.       Plan Discussed with:   Anesthesia Plan Comments:         Anesthesia Quick Evaluation

## 2020-06-02 ENCOUNTER — Encounter: Payer: Self-pay | Admitting: Orthopedic Surgery

## 2020-07-11 ENCOUNTER — Emergency Department
Admission: EM | Admit: 2020-07-11 | Discharge: 2020-07-12 | Disposition: A | Discharge status: Home / Self Care | Payer: Medicaid Other | Attending: Emergency Medicine | Admitting: Emergency Medicine

## 2020-07-11 ENCOUNTER — Emergency Department: Payer: Medicaid Other

## 2020-07-11 ENCOUNTER — Other Ambulatory Visit: Payer: Self-pay

## 2020-07-11 DIAGNOSIS — R569 Unspecified convulsions: Secondary | ICD-10-CM | POA: Diagnosis not present

## 2020-07-11 DIAGNOSIS — N179 Acute kidney failure, unspecified: Secondary | ICD-10-CM

## 2020-07-11 DIAGNOSIS — Z7982 Long term (current) use of aspirin: Secondary | ICD-10-CM | POA: Diagnosis not present

## 2020-07-11 DIAGNOSIS — Z79899 Other long term (current) drug therapy: Secondary | ICD-10-CM | POA: Insufficient documentation

## 2020-07-11 DIAGNOSIS — E1065 Type 1 diabetes mellitus with hyperglycemia: Secondary | ICD-10-CM | POA: Diagnosis not present

## 2020-07-11 DIAGNOSIS — I10 Essential (primary) hypertension: Secondary | ICD-10-CM

## 2020-07-11 DIAGNOSIS — Z794 Long term (current) use of insulin: Secondary | ICD-10-CM | POA: Insufficient documentation

## 2020-07-11 DIAGNOSIS — R739 Hyperglycemia, unspecified: Secondary | ICD-10-CM | POA: Diagnosis present

## 2020-07-11 LAB — COMPREHENSIVE METABOLIC PANEL
ALT: 60 U/L — ABNORMAL HIGH (ref 0–44)
AST: 45 U/L — ABNORMAL HIGH (ref 15–41)
Albumin: 3.5 g/dL (ref 3.5–5.0)
Alkaline Phosphatase: 100 U/L (ref 38–126)
Anion gap: 7 (ref 5–15)
BUN: 35 mg/dL — ABNORMAL HIGH (ref 6–20)
CO2: 21 mmol/L — ABNORMAL LOW (ref 22–32)
Calcium: 8.8 mg/dL — ABNORMAL LOW (ref 8.9–10.3)
Chloride: 103 mmol/L (ref 98–111)
Creatinine, Ser: 1.67 mg/dL — ABNORMAL HIGH (ref 0.61–1.24)
GFR, Estimated: 54 mL/min — ABNORMAL LOW (ref >60)
Glucose, Bld: 402 mg/dL — ABNORMAL HIGH (ref 70–99)
Potassium: 5.7 mmol/L — ABNORMAL HIGH (ref 3.5–5.1)
Sodium: 131 mmol/L — ABNORMAL LOW (ref 135–145)
Total Bilirubin: 0.7 mg/dL (ref 0.3–1.2)
Total Protein: 6.4 g/dL — ABNORMAL LOW (ref 6.5–8.1)

## 2020-07-11 LAB — BLOOD GAS, VENOUS
Acid-base deficit: 2.1 mmol/L — ABNORMAL HIGH (ref 0.0–2.0)
Bicarbonate: 22.3 mmol/L (ref 20.0–28.0)
O2 Saturation: 98.5 %
Patient temperature: 37
pCO2, Ven: 36 mmHg — ABNORMAL LOW (ref 44.0–60.0)
pH, Ven: 7.4 (ref 7.250–7.430)
pO2, Ven: 114 mmHg — ABNORMAL HIGH (ref 32.0–45.0)

## 2020-07-11 LAB — CBC
HCT: 33.2 % — ABNORMAL LOW (ref 39.0–52.0)
Hemoglobin: 11.5 g/dL — ABNORMAL LOW (ref 13.0–17.0)
MCH: 29.8 pg (ref 26.0–34.0)
MCHC: 34.6 g/dL (ref 30.0–36.0)
MCV: 86 fL (ref 80.0–100.0)
Platelets: 357 10*3/uL (ref 150–400)
RBC: 3.86 MIL/uL — ABNORMAL LOW (ref 4.22–5.81)
RDW: 12.5 % (ref 11.5–15.5)
WBC: 14.2 10*3/uL — ABNORMAL HIGH (ref 4.0–10.5)
nRBC: 0 % (ref 0.0–0.2)

## 2020-07-11 MED ORDER — KETOROLAC TROMETHAMINE 30 MG/ML IJ SOLN
30.0000 mg | Freq: Once | INTRAMUSCULAR | Status: AC
  Administered 2020-07-12: 30 mg via INTRAVENOUS
  Filled 2020-07-11: qty 1

## 2020-07-11 MED ORDER — SODIUM CHLORIDE 0.9 % IV BOLUS
1000.0000 mL | Freq: Once | INTRAVENOUS | Status: AC
  Administered 2020-07-12: 1000 mL via INTRAVENOUS

## 2020-07-11 NOTE — ED Triage Notes (Signed)
Pt is diabetic and on insulin pump. 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 Pt states since seizure has had high blood sugar and htn. Pt with noted laceration to left lower lip. Blood sugar on insulin pump is currently 378. Pt denies headache, nausea, vomiting.

## 2020-07-12 LAB — URINALYSIS, COMPLETE (UACMP) WITH MICROSCOPIC
Bacteria, UA: NONE SEEN
Bilirubin Urine: NEGATIVE
Glucose, UA: >=500 mg/dL — AB
Ketones, ur: NEGATIVE mg/dL
Leukocytes,Ua: NEGATIVE
Nitrite: NEGATIVE
Protein, ur: 100 mg/dL — AB
Specific Gravity, Urine: 1.012 (ref 1.005–1.030)
Squamous Epithelial / HPF: NONE SEEN (ref 0–5)
pH: 6 (ref 5.0–8.0)

## 2020-07-12 LAB — POTASSIUM: Potassium: 4.6 mmol/L (ref 3.5–5.1)

## 2020-07-12 LAB — CBG MONITORING, ED
Glucose-Capillary: 239 mg/dL — ABNORMAL HIGH (ref 70–99)
Glucose-Capillary: 276 mg/dL — ABNORMAL HIGH (ref 70–99)

## 2020-07-12 LAB — BETA-HYDROXYBUTYRIC ACID: Beta-Hydroxybutyric Acid: 0.53 mmol/L — ABNORMAL HIGH (ref 0.05–0.27)

## 2020-07-12 MED ORDER — INSULIN ASPART 100 UNIT/ML ~~LOC~~ SOLN
10.0000 [IU] | Freq: Once | SUBCUTANEOUS | Status: AC
  Administered 2020-07-12: 10 [IU] via INTRAVENOUS
  Filled 2020-07-12: qty 1

## 2020-07-12 NOTE — ED Provider Notes (Signed)
[facility] Emergency Department Provider Note ____________________________________________   Event Date/Time   First MD Initiated Contact with Patient 07/11/20 2348     (approximate)  I have reviewed the triage vital signs and the nursing notes.   HISTORY  Chief Complaint Hyperglycemia and Hypertension    HPI Kenneth Rodgers is a 35 y.o. male with history of type 1 diabetes, hypertension, hyperlipidemia who presents to the emergency department with complaints of hyperglycemia and hypertension.  He states earlier today at lunch he was out to eat with his family when he had a seizure.  States his blood sugar was 51 at that time.  He has had a previous seizure due to hypoglycemia in 1997.  He did bite the right side of his tongue and his left lower lip.  States he did fall off of a tall barstool onto the ground.  Denies any headache currently.  No neck or back pain.  States he is having bilateral shoulder pain but can move them without difficulty.  States he feels sore all over.  He is not on any blood thinners.  States now his blood sugars are reading "high".  He does have a continuous blood glucose monitor and insulin pump on.  He never stopped his insulin pump today.  He has had DKA before but does not have symptoms of DKA today.  No nausea, vomiting or diarrhea.  Patient also was concerned because his blood pressure was high at home.  Blood pressure was 224/110 on multiple checks.  He denies any headache, vision changes, numbness, tingling, weakness, chest pain or shortness of breath.         Past Medical History:  Diagnosis Date  . Diabetes type I (HCC)    started on Tandem insulin pump 04/2020; has Dexcom  . Diabetic retinopathy associated with type 1 diabetes mellitus (HCC)   . Hyperlipidemia   . Hypertension   . Legally blind   . 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 Sepsis (HCC) 2011   s/p tooth extraction and infected jaw    There  are no problems to display for this patient.   Past Surgical History:  Procedure Laterality Date  . DENTAL SURGERY  2011   jaw infection (sepsis) from tooth extraction  . SHOULDER ARTHROSCOPY WITH LABRAL REPAIR Left 06/01/2020   Procedure: LEFT SHOULDER ARTHROSCOPY WITH LABRAL REPAIR AND SUBACCROMINAL DECOMPRESSION;  Surgeon: Juanell FairlyKrasinski, Kevin, MD;  Location: [facility] ORS;  Service: Orthopedics;  Laterality: Left;  WITH BLOCK    Prior to Admission medications   Medication Sig Start Date End Date Taking? Authorizing Provider  aspirin EC 81 MG tablet Take 81 mg by mouth daily. Swallow whole.    [provider]  atorvastatin (LIPITOR) 10 MG tablet Take 10 mg by mouth daily. 03/06/20   [provider]  cetirizine (ZYRTEC) 10 MG tablet Take 10 mg by mouth daily.    [provider]  fluticasone (FLONASE) 50 MCG/ACT nasal spray Place 2 sprays into both nostrils daily as needed for allergies or rhinitis.    [provider]  HUMALOG 100 UNIT/ML injection Inject 80 Units into the skin daily. Via Pump 05/03/20   [provider]  losartan (COZAAR) 50 MG tablet Take 100 mg by mouth daily. 04/08/20   [provider]  magnesium gluconate (MAGONATE) 500 MG tablet Take 500 mg by mouth daily.    [provider]  Multiple Vitamin (MULTIVITAMIN WITH MINERALS) TABS tablet Take 1  tablet by mouth daily.    [provider]  ondansetron (ZOFRAN) 4 MG tablet Take 1 tablet (4 mg total) by mouth every 8 (eight) hours as needed for nausea or vomiting. 06/01/20   Juanell Fairly, MD  oxyCODONE (OXY IR/ROXICODONE) 5 MG immediate release tablet Take 1 tablet (5 mg total) by mouth every 4 (four) hours as needed. 06/01/20   Juanell Fairly, MD    Allergies Patient has no known allergies.  Family History  Problem Relation Age of Onset  . Pancreatic cancer Father     Social History Social History   Tobacco Use  . Smoking status: Never Smoker  .  Smokeless tobacco: Never Used  Vaping Use  . Vaping Use: Never used  Substance Use Topics  . Alcohol use: Not Currently  . Drug use: Never    Review of Systems Constitutional: No fever. Eyes: No visual changes. ENT: No sore throat. Cardiovascular: Denies chest pain. Respiratory: Denies shortness of breath. Gastrointestinal: No nausea, vomiting, diarrhea. Genitourinary: Negative for dysuria. Musculoskeletal: Negative for back pain. Skin: Negative for rash. Neurological: Negative for focal weakness or numbness.   ____________________________________________   PHYSICAL EXAM:  VITAL SIGNS: ED Triage Vitals  Enc Vitals Group     BP 07/11/20 2321 (!) 190/104     Pulse Rate 07/11/20 2321 (!) 112     Resp 07/11/20 2321 (!) 22     Temp 07/11/20 2321 98.2 F (36.8 C)     Temp Source 07/11/20 2321 Oral     SpO2 07/11/20 2321 98 %     Weight 07/11/20 2322 215 lb (97.5 kg)     Height 07/11/20 2322 6\' 2"  (1.88 m)     Head Circumference --      Peak Flow --      Pain Score 07/11/20 2322 0     Pain Loc --      Pain Edu? --      Excl. in GC? --    CONSTITUTIONAL: Alert and oriented and responds appropriately to questions. Well-appearing; well-nourished; GCS 15 HEAD: Normocephalic; atraumatic EYES: Conjunctivae clear, PERRL, EOMI ENT: normal nose; no rhinorrhea; moist mucous membranes; pharynx without lesions noted; no dental injury; no septal hematoma, small abrasion noted to the left lower lip and right lateral tongue without laceration NECK: Supple, no meningismus, no LAD; no midline spinal tenderness, step-off or deformity; trachea midline CARD: Regular and tachycardic; S1 and S2 appreciated; no murmurs, no clicks, no rubs, no gallops RESP: Normal chest excursion without splinting or tachypnea; breath sounds clear and equal bilaterally; no wheezes, no rhonchi, no rales; no hypoxia or respiratory distress CHEST:  chest wall stable, no crepitus or ecchymosis or deformity,  nontender to palpation; no flail chest ABD/GI: Normal bowel sounds; non-distended; soft, non-tender, no rebound, no guarding; no ecchymosis or other lesions noted PELVIS:  stable, nontender to palpation BACK:  The back appears normal and is non-tender to palpation, there is no CVA tenderness; no midline spinal tenderness, step-off or deformity EXT: Normal ROM in all joints; non-tender to palpation; no edema; normal capillary refill; no cyanosis, no bony tenderness or bony deformity of patient's extremities, no joint effusion, compartments are soft, extremities are warm and well-perfused, no ecchymosis SKIN: Normal color for age and race; warm NEURO: Moves all extremities equally, normal sensation diffusely except for 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 Grooming and personal hygiene are appropriate.  ____________________________________________   LABS (all labs ordered are listed, but only abnormal results are displayed)  Labs Reviewed  CBC - Abnormal; Notable for the following components:      Result Value   WBC 14.2 (*)    RBC 3.86 (*)    Hemoglobin 11.5 (*)    HCT 33.2 (*)    All other components within normal limits  URINALYSIS, COMPLETE (UACMP) WITH MICROSCOPIC - Abnormal; Notable for the following components:   Color, Urine STRAW (*)    APPearance CLEAR (*)    Glucose, UA >=500 (*)    Hgb urine dipstick SMALL (*)    Protein, ur 100 (*)    All other components within normal limits  COMPREHENSIVE METABOLIC PANEL - Abnormal; Notable for the following components:   Sodium 131 (*)    Potassium 5.7 (*)    CO2 21 (*)    Glucose, Bld 402 (*)    BUN 35 (*)    Creatinine, Ser 1.67 (*)    Calcium 8.8 (*)    Total Protein 6.4 (*)    AST 45 (*)    ALT 60 (*)    GFR, Estimated 54 (*)    All other components within normal limits  BLOOD GAS, VENOUS -  Abnormal; Notable for the following components:   pCO2, Ven 36 (*)    pO2, Ven 114.0 (*)    Acid-base deficit 2.1 (*)    All other components within normal limits  BETA-HYDROXYBUTYRIC ACID - Abnormal; Notable for the following components:   Beta-Hydroxybutyric Acid 0.53 (*)    All other components within normal limits  CBG MONITORING, ED - Abnormal; Notable for the following components:   Glucose-Capillary 276 (*)    All other components within normal limits  CBG MONITORING, ED - Abnormal; Notable for the following components:   Glucose-Capillary 239 (*)    All other components within normal limits  POTASSIUM  CBG MONITORING, ED  CBG MONITORING, ED  CBG MONITORING, ED  CBG MONITORING, ED  CBG MONITORING, ED  CBG MONITORING, ED  CBG MONITORING, ED  CBG MONITORING, ED  CBG MONITORING, ED  CBG MONITORING, ED  CBG MONITORING, ED  CBG MONITORING, ED  CBG MONITORING, ED  CBG MONITORING, ED  CBG MONITORING, ED  CBG MONITORING, ED  CBG MONITORING, ED  CBG MONITORING, ED  CBG MONITORING, ED  CBG MONITORING, ED  CBG MONITORING, ED  CBG MONITORING, ED   ____________________________________________  EKG   EKG Interpretation  Date/Time:  Monday July 12 2020 01:41:14 EDT Ventricular Rate:  89 PR Interval:  153 QRS Duration: 77 QT Interval:  363 QTC Calculation: 442 R Axis:   74 Text Interpretation: Sinus rhythm ST elev, probable normal early repol pattern Confirmed by Rochele Raring [phone]) on 07/12/2020 1:42:15 AM       ____________________________________________  RADIOLOGY Normajean Baxter Chanah Tidmore, personally viewed and evaluated these images (plain radiographs) as part of my medical decision making, as well as reviewing the written report by the radiologist.  ED MD interpretation:  none  Official radiology report(s): No results found.  ____________________________________________   PROCEDURES  Procedure(s) performed (including Critical  Care):  Procedures    ____________________________________________   INITIAL IMPRESSION / ASSESSMENT AND PLAN / ED COURSE  As part of my medical decision making, I reviewed the following data within the electronic MEDICAL RECORD NUMBER Nursing notes reviewed and incorporated, Labs reviewed , EKG interpreted  Old EKG reviewed, Old chart reviewed and Notes from prior ED visits  Patient here after a diabetic seizure.  Suspect due to hypoglycemia.  No signs of traumatic injury on exam.  Will give Toradol as he complains of feeling sore all over.  I do not feel he needs any acute imaging.  Discussed risk and benefits of CT imaging of his head.  He is neurologically intact at this time and I do not think that he has a lesion in his brain that causes seizure today.  Also doubt intracranial hemorrhage given no complaints of headache.  He reports he is fine holding off on imaging.  Also concerned because his blood glucose is measuring time.  We will check labs, urine, blood gas to evaluate for DKA.  Negative IV fluids.  ED PROGRESS  Patient's labs show minimally elevated potassium and creatinine.  He has received IV fluids.  We will recheck his potassium level.  EKG normal.  He states he has had hyperkalemia before that was rechecked prior to his shoulder surgery and had improved.  Blood glucose has improved.  He is not in DKA today.  Normal pH, bicarb, anion gap and no ketones in his urine.  He reports feeling better after Toradol.  This was given before creatinine had resulted.  Will avoid further nephrotoxic medications.  2:17 AM  Pt's repeat potassium is normal.  He has received fluids for his AKI but this appears to be similar to where it was in March 2022.  Have recommended follow-up with his primary care physician for this.  Recommended he avoid NSAIDs.  I feel he is safe for discharge home.  At this time, I do not feel there is any life-threatening condition present. I have reviewed,  interpreted and discussed all results (EKG, imaging, lab, urine as appropriate) and exam findings with patient/family. I have reviewed nursing notes and appropriate previous records.  I feel the patient is safe to be discharged home without further emergent workup and can continue workup as an outpatient as needed. Discussed usual and customary return precautions. Patient/family verbalize understanding and are comfortable with this plan.  Outpatient follow-up has been provided as needed. All questions have been answered.  ____________________________________________   FINAL CLINICAL IMPRESSION(S) / ED DIAGNOSES  Final diagnoses:  Hyperglycemia  Hypertension, unspecified type  Seizure (HCC)  AKI (acute kidney injury) [facility])     ED Discharge Orders    None      *Please note:  Aster Eckrich was evaluated in Emergency Department on 07/12/2020 for the symptoms described in the history of present illness. He was evaluated in the context of the global COVID-19 pandemic, which necessitated consideration that the patient might be at risk for infection with the SARS-CoV-2 virus that causes COVID-19. Institutional protocols and algorithms that pertain to the evaluation of patients at risk for COVID-19 are in a state of rapid change based on information released by regulatory bodies including the CDC and federal and state organizations. These policies and algorithms were followed during the patient's care in the ED.  Some ED evaluations and interventions may be delayed as a result of limited staffing during and the pandemic.*   Note:  This document was prepared using Dragon voice recognition software and may include unintentional dictation errors.   Keera Altidor, Layla Maw, DO 07/12/20 [phone]

## 2020-07-12 NOTE — Discharge Instructions (Addendum)
Your blood work did not show that you are in DKA.  Your first potassium level was elevated but on recheck had normalized.  Your kidney function also noted your creatinine was slightly elevated at 1.67.  I recommend that you have this rechecked by your primary care doctor in 1 to 2 weeks.  Please avoid NSAIDs such as ibuprofen, aspirin, Aleve.  It is safe to take Tylenol 1000 mg every 6 hours as needed for pain.  I suspect that your seizure today was secondary to hypoglycemia.  You do not need to be started on seizure medications at this time.

## 2020-07-12 NOTE — ED Notes (Signed)
DC instructions reviewed with patient, no additional questions.

## 2021-01-09 ENCOUNTER — Encounter: Payer: Self-pay | Admitting: *Deleted

## 2021-01-09 ENCOUNTER — Other Ambulatory Visit: Payer: Self-pay

## 2021-01-09 ENCOUNTER — Emergency Department: Payer: Medicaid Other

## 2021-01-09 DIAGNOSIS — E785 Hyperlipidemia, unspecified: Secondary | ICD-10-CM | POA: Diagnosis not present

## 2021-01-09 DIAGNOSIS — E1069 Type 1 diabetes mellitus with other specified complication: Secondary | ICD-10-CM | POA: Insufficient documentation

## 2021-01-09 DIAGNOSIS — Z79899 Other long term (current) drug therapy: Secondary | ICD-10-CM | POA: Diagnosis not present

## 2021-01-09 DIAGNOSIS — R059 Cough, unspecified: Secondary | ICD-10-CM | POA: Diagnosis present

## 2021-01-09 DIAGNOSIS — J189 Pneumonia, unspecified organism: Secondary | ICD-10-CM | POA: Insufficient documentation

## 2021-01-09 DIAGNOSIS — Z794 Long term (current) use of insulin: Secondary | ICD-10-CM | POA: Insufficient documentation

## 2021-01-09 DIAGNOSIS — R0789 Other chest pain: Secondary | ICD-10-CM | POA: Insufficient documentation

## 2021-01-09 DIAGNOSIS — Z7982 Long term (current) use of aspirin: Secondary | ICD-10-CM | POA: Diagnosis not present

## 2021-01-09 DIAGNOSIS — A419 Sepsis, unspecified organism: Secondary | ICD-10-CM | POA: Diagnosis not present

## 2021-01-09 DIAGNOSIS — Z20822 Contact with and (suspected) exposure to covid-19: Secondary | ICD-10-CM | POA: Insufficient documentation

## 2021-01-09 DIAGNOSIS — I10 Essential (primary) hypertension: Secondary | ICD-10-CM | POA: Diagnosis not present

## 2021-01-09 LAB — CBC
HCT: 35.7 % — ABNORMAL LOW (ref 39.0–52.0)
Hemoglobin: 12.7 g/dL — ABNORMAL LOW (ref 13.0–17.0)
MCH: 31.6 pg (ref 26.0–34.0)
MCHC: 35.6 g/dL (ref 30.0–36.0)
MCV: 88.8 fL (ref 80.0–100.0)
Platelets: 232 10*3/uL (ref 150–400)
RBC: 4.02 MIL/uL — ABNORMAL LOW (ref 4.22–5.81)
RDW: 12.1 % (ref 11.5–15.5)
WBC: 13.1 10*3/uL — ABNORMAL HIGH (ref 4.0–10.5)
nRBC: 0 % (ref 0.0–0.2)

## 2021-01-09 LAB — BASIC METABOLIC PANEL
Anion gap: 13 (ref 5–15)
BUN: 22 mg/dL — ABNORMAL HIGH (ref 6–20)
CO2: 24 mmol/L (ref 22–32)
Calcium: 8.8 mg/dL — ABNORMAL LOW (ref 8.9–10.3)
Chloride: 100 mmol/L (ref 98–111)
Creatinine, Ser: 1.46 mg/dL — ABNORMAL HIGH (ref 0.61–1.24)
GFR, Estimated: >60 mL/min (ref >60)
Glucose, Bld: 106 mg/dL — ABNORMAL HIGH (ref 70–99)
Potassium: 4.5 mmol/L (ref 3.5–5.1)
Sodium: 137 mmol/L (ref 135–145)

## 2021-01-09 LAB — CBG MONITORING, ED: Glucose-Capillary: 101 mg/dL — ABNORMAL HIGH (ref 70–99)

## 2021-01-09 LAB — TROPONIN I (HIGH SENSITIVITY): Troponin I (High Sensitivity): 15 ng/L (ref <18)

## 2021-01-09 NOTE — ED Triage Notes (Signed)
Pt to ED reporting chest tightness and lightheadedness with a chest congestion over the past couple days that has worsened today. Hx of HTN but worsening blood pressure today of 220/160. Fever that resolved without medication.

## 2021-01-10 ENCOUNTER — Emergency Department: Payer: Medicaid Other

## 2021-01-10 ENCOUNTER — Emergency Department
Admission: EM | Admit: 2021-01-10 | Discharge: 2021-01-10 | Disposition: A | Discharge status: Home / Self Care | Payer: Medicaid Other | Attending: Emergency Medicine | Admitting: Emergency Medicine

## 2021-01-10 DIAGNOSIS — A419 Sepsis, unspecified organism: Secondary | ICD-10-CM

## 2021-01-10 DIAGNOSIS — R0789 Other chest pain: Secondary | ICD-10-CM

## 2021-01-10 DIAGNOSIS — J189 Pneumonia, unspecified organism: Secondary | ICD-10-CM

## 2021-01-10 LAB — LACTIC ACID, PLASMA: Lactic Acid, Venous: 1 mmol/L (ref 0.5–1.9)

## 2021-01-10 LAB — TROPONIN I (HIGH SENSITIVITY): Troponin I (High Sensitivity): 16 ng/L (ref <18)

## 2021-01-10 LAB — D-DIMER, QUANTITATIVE: D-Dimer, Quant: 0.59 ug/mL-FEU — ABNORMAL HIGH (ref 0.00–0.50)

## 2021-01-10 LAB — CBG MONITORING, ED
Glucose-Capillary: 174 mg/dL — ABNORMAL HIGH (ref 70–99)
Glucose-Capillary: 153 mg/dL — ABNORMAL HIGH (ref 70–99)

## 2021-01-10 LAB — RESP PANEL BY RT-PCR (FLU A&B, COVID) ARPGX2
Influenza A by PCR: NEGATIVE
Influenza B by PCR: NEGATIVE
SARS Coronavirus 2 by RT PCR: NEGATIVE

## 2021-01-10 MED ORDER — CEFTRIAXONE SODIUM 2 G IJ SOLR
2.0000 g | INTRAMUSCULAR | Status: DC
  Administered 2021-01-10: 2 g via INTRAVENOUS
  Filled 2021-01-10: qty 20

## 2021-01-10 MED ORDER — LACTATED RINGERS IV BOLUS
1000.0000 mL | Freq: Once | INTRAVENOUS | Status: AC
  Administered 2021-01-10: 1000 mL via INTRAVENOUS

## 2021-01-10 MED ORDER — LACTATED RINGERS IV SOLN: INTRAVENOUS | Status: DC

## 2021-01-10 MED ORDER — SODIUM CHLORIDE 0.9 % IV SOLN
500.0000 mg | INTRAVENOUS | Status: DC
  Administered 2021-01-10: 500 mg via INTRAVENOUS
  Filled 2021-01-10: qty 500

## 2021-01-10 MED ORDER — IOHEXOL 350 MG/ML SOLN
75.0000 mL | Freq: Once | INTRAVENOUS | Status: AC | PRN
  Administered 2021-01-10: 75 mL via INTRAVENOUS

## 2021-01-10 MED ORDER — DOXYCYCLINE HYCLATE 100 MG PO TABS: 100.0000 mg | ORAL_TABLET | Freq: Two times a day (BID) | ORAL | 0 refills | Status: AC

## 2021-01-10 MED ORDER — ONDANSETRON 4 MG PO TBDP
ORAL_TABLET | ORAL | Status: AC
  Filled 2021-01-10: qty 1

## 2021-01-10 MED ORDER — ONDANSETRON 4 MG PO TBDP
4.0000 mg | ORAL_TABLET | Freq: Once | ORAL | Status: AC | PRN
  Administered 2021-01-10: 4 mg via ORAL

## 2021-01-10 MED ORDER — KETOROLAC TROMETHAMINE 30 MG/ML IJ SOLN
15.0000 mg | Freq: Once | INTRAMUSCULAR | Status: AC
  Administered 2021-01-10: 15 mg via INTRAVENOUS
  Filled 2021-01-10: qty 1

## 2021-01-10 MED ORDER — ACETAMINOPHEN 500 MG PO TABS
1000.0000 mg | ORAL_TABLET | Freq: Once | ORAL | Status: AC
  Administered 2021-01-10: 1000 mg via ORAL
  Filled 2021-01-10: qty 2

## 2021-01-10 NOTE — Progress Notes (Signed)
Elink following for code sepsis 

## 2021-01-10 NOTE — ED Provider Notes (Signed)
[facility] Emergency Department Provider Note ____________________________________________   Event Date/Time   First MD Initiated Contact with Patient 01/10/21 [phone]     (approximate)  I have reviewed the triage vital signs and the nursing notes.  HISTORY  Chief Complaint Chest Pain   HPI Kenneth Rodgers is a 35 y.o. malewho presents to the ED for evaluation of congestion, chest pain and feeling poorly.  Chart review indicates type I diabetic with history of HTN and HLD.  Insulin pump.    Patient presents to the ED for evaluation of 2-3 days of cough, upper respiratory congestion, presyncope with standing and chest discomfort with deep inspiration.  He reports his toddler is sick with a upper respiratory infection at home.  He reports primarily congestion, rhinorrhea and postnasal drip.  Further reports generalized feeling unwell, weakness and presyncope with standing without syncope or falls.  Denies abdominal pain, emesis, diarrhea.  Reports his blood glucose levels have been well controlled with his insulin pump.  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   Past Surgical History:  Procedure Laterality Date   DENTAL SURGERY  2011   jaw infection (sepsis) from tooth extraction   SHOULDER ARTHROSCOPY WITH LABRAL REPAIR Left 06/01/2020   Procedure: LEFT SHOULDER ARTHROSCOPY WITH LABRAL REPAIR AND SUBACCROMINAL DECOMPRESSION;  Surgeon: Juanell Fairly, MD;  Location: [facility] ORS;  Service: Orthopedics;  Laterality: Left;  WITH BLOCK    Prior to Admission medications   Medication Sig Start Date End  Date Taking? Authorizing Provider  aspirin EC 81 MG tablet Take 81 mg by mouth daily. Swallow whole.    [provider]  atorvastatin (LIPITOR) 10 MG tablet Take 10 mg by mouth daily. 03/06/20   [provider]  cetirizine (ZYRTEC) 10 MG tablet Take 10 mg by mouth daily.    [provider]  fluticasone (FLONASE) 50 MCG/ACT nasal spray Place 2 sprays into both nostrils daily as needed for allergies or rhinitis.    [provider]  HUMALOG 100 UNIT/ML injection Inject 80 Units into the skin daily. Via Pump 05/03/20   [provider]  losartan (COZAAR) 50 MG tablet Take 100 mg by mouth daily. 04/08/20   [provider]  magnesium gluconate (MAGONATE) 500 MG tablet Take 500 mg by mouth daily.    [provider]  Multiple Vitamin (MULTIVITAMIN WITH MINERALS) TABS tablet Take 1 tablet by mouth daily.    [provider]  ondansetron (ZOFRAN) 4 MG tablet Take 1 tablet (4 mg total) by mouth every 8 (eight) hours as needed for nausea or vomiting. 06/01/20   Juanell Fairly, MD  oxyCODONE (OXY IR/ROXICODONE) 5 MG immediate release tablet Take 1 tablet (5 mg total) by mouth every 4 (four) hours as needed. 06/01/20   Juanell Fairly, MD    Allergies Patient has no known allergies.  Family History  Problem Relation Age of Onset   Pancreatic cancer Father     Social History Social History   Tobacco Use   Smoking status: Never   Smokeless tobacco:  Never  Vaping Use   Vaping Use: Never used  Substance Use Topics   Alcohol use: Not Currently   Drug use: Never    Review of Systems  Constitutional: Positive for generalized weakness, subjective chills and presyncope with standing. Eyes: No visual changes. ENT: No sore throat. Positive for congestion, rhinorrhea and postnasal drip. Cardiovascular: Positive for pleuritic chest pain. Respiratory: Denies shortness of breath.  Positive for cough Gastrointestinal: No abdominal pain.   No nausea, no vomiting.  No diarrhea.  No constipation. Genitourinary: Negative for dysuria. Musculoskeletal: Negative for back pain. Skin: Negative for rash. Neurological: Negative for headaches, focal weakness or numbness.  ____________________________________________   PHYSICAL EXAM:  VITAL SIGNS: Vitals:   01/10/21 0500 01/10/21 0545  BP: (!) 141/75 137/76  Pulse: (!) 114 (!) 111  Resp: (!) 23 20  Temp: 99.1 F (37.3 C)   SpO2: 93% 93%     Constitutional: Alert and oriented. Well appearing and in no acute distress.  Pleasant and conversational in full sentences.  Eyes: Conjunctivae are normal. PERRL. EOMI. Head: Atraumatic. Nose: Clear congestion/rhinnorhea. Mouth/Throat: Mucous membranes are dry.  Oropharynx non-erythematous. Neck: No stridor. No cervical spine tenderness to palpation. Cardiovascular: Tachycardic rate, regular rhythm. Grossly normal heart sounds.  Good peripheral circulation. Respiratory: Normal respiratory effort.  No retractions. Lungs CTAB. Gastrointestinal: Soft , nondistended, nontender to palpation. No CVA tenderness. Musculoskeletal: No lower extremity tenderness nor edema.  No joint effusions. No signs of acute trauma. Neurologic:  Normal speech and language. No gross focal neurologic deficits are appreciated. No gait instability noted. Skin:  Skin is warm, dry and intact. No rash noted. Psychiatric: Mood and affect are normal. Speech and behavior are normal.  ____________________________________________   LABS (all labs ordered are listed, but only abnormal results are displayed)  Labs Reviewed  BASIC METABOLIC PANEL - Abnormal; Notable for the following components:      Result Value   Glucose, Bld 106 (*)    BUN 22 (*)    Creatinine, Ser 1.46 (*)    Calcium 8.8 (*)    All other components within normal limits  CBC - Abnormal; Notable for the following components:   WBC 13.1 (*)    RBC 4.02 (*)    Hemoglobin 12.7 (*)    HCT 35.7 (*)     All other components within normal limits  D-DIMER, QUANTITATIVE - Abnormal; Notable for the following components:   D-Dimer, Quant 0.59 (*)    All other components within normal limits  CBG MONITORING, ED - Abnormal; Notable for the following components:   Glucose-Capillary 101 (*)    All other components within normal limits  CBG MONITORING, ED - Abnormal; Notable for the following components:   Glucose-Capillary 174 (*)    All other components within normal limits  CBG MONITORING, ED - Abnormal; Notable for the following components:   Glucose-Capillary 153 (*)    All other components within normal limits  RESP PANEL BY RT-PCR (FLU A&B, COVID) ARPGX2  CULTURE, BLOOD (SINGLE)  LACTIC ACID, PLASMA  LACTIC ACID, PLASMA  TROPONIN I (HIGH SENSITIVITY)  TROPONIN I (HIGH SENSITIVITY)   ____________________________________________  12 Lead EKG  Sinus tachycardia with a rate of 122 bpm.  Normal axis and intervals.  No evidence of acute ischemia. ____________________________________________  RADIOLOGY  ED MD interpretation: 2 view CXR reviewed by me with bilateral perihilar prominence without discrete lobar infiltration  Official radiology report(s): DG Chest 2 View  Result Date: 01/09/2021 CLINICAL DATA:  Chest pain  and shortness of breath.  Fever. EXAM: CHEST - 2 VIEW COMPARISON:  None. FINDINGS: Normal heart size and mediastinal contours. There is no confluent airspace disease. Vague interstitial prominence in the suprahilar regions. No pulmonary edema, pleural effusion, or pneumothorax. No acute osseous abnormalities are seen. IMPRESSION: Vague interstitial prominence in the suprahilar regions may be atelectasis or atypical infection. Electronically Signed   By: Narda Rutherford M.D.   On: 01/09/2021 21:04   CT Angio Chest PE W and/or Wo Contrast  Result Date: 01/10/2021 CLINICAL DATA:  35 year old male with pleuritic chest pain, thick kidney a, viral URI syndrome.  Hypertensive, 220 systolic. EXAM: CT ANGIOGRAPHY CHEST WITH CONTRAST TECHNIQUE: Multidetector CT imaging of the chest was performed using the standard protocol during bolus administration of intravenous contrast. Multiplanar CT image reconstructions and MIPs were obtained to evaluate the vascular anatomy. CONTRAST:  76mL OMNIPAQUE IOHEXOL 350 MG/ML SOLN COMPARISON:  Chest radiographs 01/09/2021. FINDINGS: Cardiovascular: Suboptimal, but adequate. There is mild respiratory motion, most pronounced in the left lower lobe. No focal filling defect identified in the pulmonary arteries to suggest acute pulmonary embolism. Contrast bolus timing in the pulmonary arterial tree. Negative thoracic aorta. There is some calcified coronary artery atherosclerosis suspected on series 5, image 211. No cardiomegaly or pericardial effusion. Mediastinum/Nodes: Small volume residual thymus. No mediastinal mass or lymphadenopathy. Small but asymmetric right hilar lymph nodes (series 4, image 42 and 46) appear reactive. Lungs/Pleura: Major airways are patent. The left lung is clear aside from minor dependent atelectasis. There is mild peribronchial nodularity in the posterior right upper lobe. There is moderate peribronchial patchy and nodular opacity with tree-in-bud appearance in the superior segment of the right lower lobe. Additional mild right lung base atelectasis. Middle lobe is spared. No pleural effusion. Upper Abdomen: Negative visible liver, gallbladder, spleen, pancreas, adrenal glands, kidneys, and bowel in the upper abdomen. Musculoskeletal: No acute osseous abnormality identified. Review of the MIP images confirms the above findings. IMPRESSION: 1. Negative for acute pulmonary embolus. 2. Right lower lobe superior segment bronchopneumonia with early extension to the right upper lobe and reactive right hilar lymph nodes. No pleural effusion. Electronically Signed   By: Odessa Fleming M.D.   On: 01/10/2021 05:57     ____________________________________________   PROCEDURES and INTERVENTIONS  Procedure(s) performed (including Critical Care):  .1-3 Lead EKG Interpretation Performed by: Delton Prairie, MD Authorized by: Delton Prairie, MD     Interpretation: abnormal     ECG rate:  114   ECG rate assessment: tachycardic     Rhythm: sinus tachycardia     Ectopy: none     Conduction: normal   .Critical Care Performed by: Delton Prairie, MD Authorized by: Delton Prairie, MD   Critical care provider statement:    Critical care time (minutes):  30   Critical care time was exclusive of:  Separately billable procedures and treating other patients   Critical care was necessary to treat or prevent imminent or life-threatening deterioration of the following conditions:  Sepsis   Critical care was time spent personally by me on the following activities:  Ordering and performing treatments and interventions, ordering and review of laboratory studies, ordering and review of radiographic studies, pulse oximetry, re-evaluation of patient's condition, review of old charts, evaluation of patient's response to treatment and examination of patient  Medications  ondansetron (ZOFRAN-ODT) 4 MG disintegrating tablet (  Not Given 01/10/21 0254)  lactated ringers infusion (has no administration in time range)  cefTRIAXone (ROCEPHIN) 2 g  in sodium chloride 0.9 % 100 mL IVPB (2 g Intravenous New Bag/Given 01/10/21 0617)  azithromycin (ZITHROMAX) 500 mg in sodium chloride 0.9 % 250 mL IVPB (500 mg Intravenous New Bag/Given 01/10/21 0633)  ondansetron (ZOFRAN-ODT) disintegrating tablet 4 mg (4 mg Oral Given 01/10/21 0253)  lactated ringers bolus 1,000 mL (1,000 mLs Intravenous New Bag/Given 01/10/21 0415)  acetaminophen (TYLENOL) tablet 1,000 mg (1,000 mg Oral Given 01/10/21 0413)  ketorolac (TORADOL) 30 MG/ML injection 15 mg (15 mg Intravenous Given 01/10/21 0412)  lactated ringers bolus 1,000 mL (1,000 mLs Intravenous New  Bag/Given 01/10/21 0543)  iohexol (OMNIPAQUE) 350 MG/ML injection 75 mL (75 mLs Intravenous Contrast Given 01/10/21 0518)    ____________________________________________   MDM / ED COURSE   35 year old type I diabetic presents to the ED with congestion, cough and chest pain with evidence of acute acquired pneumonia and sepsis.  He is tachycardic but hemodynamically stable and not hypoxic  Labs with mild leukocytosis.  CXR with perihilar fullness though no discrete infiltration.  His dimer returned elevated, and CTA chest shows no evidence of PE, though does show a bronchopneumonia.  He is meeting sepsis criteria for pneumonia but looks clinically quite well.  We will provide fluid resuscitation and IV antibiotics.  Patient will be signed out to oncoming provider to follow-up on his clinical reassessment.  Considering how well he looks, if he responds well to these measures I think a trial of outpatient management would be reasonable.   Clinical Course as of 01/10/21 0634  Mon Jan 10, 2021  0604 Reassessed.  Heart rate improving.  Patient reports feeling better.  We discussed work-up with evidence of pneumonia, but no evidence of PE or COVID.  We discussed IV antibiotics and the possibility of outpatient management thereafter depending on his response and clinical status.  He is in agreement. [DS]    Clinical Course User Index [DS] Delton Prairie, MD    ____________________________________________   FINAL CLINICAL IMPRESSION(S) / ED DIAGNOSES  Final diagnoses:  Other chest pain  Sepsis without acute organ dysfunction, due to unspecified organism [facility])  Community acquired pneumonia of right middle lobe of lung     ED Discharge Orders     None        Tija Biss Katrinka Blazing   Note:  This document was prepared using [employer] executive software and may include unintentional dictation errors.    Delton Prairie, MD 01/10/21 [phone]

## 2021-01-10 NOTE — ED Notes (Signed)
Pt in supine position, resp WNL. No apparent acute distress noted. Call light in reach, aware of plan of care.

## 2021-01-10 NOTE — ED Provider Notes (Signed)
I assumed care of this patient approximately 0 700.  Please see operative progress note for full details guarding patient's initial evaluation assessment.  In brief patient presents for assessment of several days of cough congestion and some mild lightheadedness.  On arrival he is noted be tachycardic and febrile with initial work-up concerning for sepsis from pneumonia.  CTA otherwise shows no evidence of PE or other acute thoracic process.  Patient otherwise fairly well-appearing and per report has responded well to fluids Tylenol and antibiotics with plan to reassess in about an hour.  On my reassessment patient's tachycardia has resolved and his SPO2 is 96%.  His fever has come down to 99.8.  I did ambulate patient in place for approximately 40 seconds and his SPO2 did not decrease below 95%.  His heart rate briefly increased to 108 although went back to the mid 90s since patient stopped walking.  Discussed with patient concern for community-acquired pneumonia.  Patient states he is feeling much better and wishes to go home.  Did discuss admitting the patient for observation although given patient strongly prefers to go home is feeling much better with no tachycardia at rest and no hypoxia with ambulation I think this is likely reasonable.  He will return immediately if he experiences any new or acute worsening of her symptoms.  Rx written for doxycycline.  Discharged stable condition.   Gilles Chiquito, MD 01/10/21 [phone]

## 2021-01-10 NOTE — Progress Notes (Signed)
AB charted as given @ 0617 and BC drawn @  0645, night RN has left already.

## 2021-01-10 NOTE — Discharge Instructions (Addendum)
Use doxycycline antibiotic twice daily for the next 10 days to treat pneumonia.  Please take Tylenol and ibuprofen/Advil for your pain.  It is safe to take them together, or to alternate them every few hours.  Take up to 1000mg  of Tylenol at a time, up to 4 times per day.  Do not take more than 4000 mg of Tylenol in 24 hours.  For ibuprofen, take 400-600 mg, 4-5 times per day.  If you develop any worsening symptoms despite these measures, please return to the ED.

## 2021-01-10 NOTE — Progress Notes (Signed)
CODE SEPSIS - PHARMACY COMMUNICATION  **Broad Spectrum Antibiotics should be administered within 1 hour of Sepsis diagnosis**  Time Code Sepsis Called/Page Received: 0609  Antibiotics Ordered: Azithromycin & Ceftriaxone  Time of 1st antibiotic administration: 9628  Otelia Sergeant, PharmD, [facility] 01/10/2021 6:12 AM

## 2021-01-15 LAB — CULTURE, BLOOD (SINGLE): Culture: NO GROWTH

## 2021-05-18 ENCOUNTER — Ambulatory Visit: Payer: Medicaid Other | Admitting: Urology

## 2021-05-18 ENCOUNTER — Other Ambulatory Visit: Payer: Self-pay

## 2021-05-18 ENCOUNTER — Encounter: Payer: Self-pay | Admitting: Urology

## 2021-05-18 VITALS — BP 157/103 | HR 89 | Ht 74.0 in | Wt 220.0 lb

## 2021-05-18 DIAGNOSIS — N529 Male erectile dysfunction, unspecified: Secondary | ICD-10-CM | POA: Diagnosis not present

## 2021-05-18 MED ORDER — TADALAFIL 5 MG PO TABS: 5.0000 mg | ORAL_TABLET | Freq: Every day | ORAL | 3 refills | Status: AC | PRN

## 2021-05-18 NOTE — Patient Instructions (Signed)
Erectile Dysfunction °Erectile dysfunction (ED) is the inability to get or keep an erection in order to have sexual intercourse. ED is considered a symptom of an underlying disorder and is not considered a disease. ED may include: °Inability to get an erection. °Lack of enough hardness of the erection to allow penetration. °Loss of erection before sex is finished. °What are the causes? °This condition may be caused by: °Physical causes, such as: °Artery problems. This may include heart disease, high blood pressure, atherosclerosis, and diabetes. °Hormonal problems, such as low testosterone. °Obesity. °Nerve problems. This may include back or pelvic injuries, multiple sclerosis, Parkinson's disease, spinal cord injury, and stroke. °Certain medicines, such as: °Pain relievers. °Antidepressants. °Blood pressure medicines and water pills (diuretics). °Cancer medicines. °Antihistamines. °Muscle relaxants. °Lifestyle factors, such as: °Use of drugs such as marijuana, cocaine, or opioids. °Excessive use of alcohol. °Smoking. °Lack of physical activity or exercise. °Psychological causes, such as: °Anxiety or stress. °Sadness or depression. °Exhaustion. °Fear about sexual performance. °Guilt. °What are the signs or symptoms? °Symptoms of this condition include: °Inability to get an erection. °Lack of enough hardness of the erection to allow penetration. °Loss of the erection before sex is finished. °Sometimes having normal erections, but with frequent unsatisfactory episodes. °Low sexual satisfaction in either partner due to erection problems. °A curved penis occurring with erection. The curve may cause pain, or the penis may be too curved to allow for intercourse. °Never having nighttime or morning erections. °How is this diagnosed? °This condition is often diagnosed by: °Performing a physical exam to find other diseases or specific problems with the penis. °Asking you detailed questions about the problem. °Doing tests,  such as: °Blood tests to check for diabetes mellitus or high cholesterol, or to measure hormone levels. °Other tests to check for underlying health conditions. °An ultrasound exam to check for scarring. °A test to check blood flow to the penis. °Doing a sleep study at home to measure nighttime erections. °How is this treated? °This condition may be treated by: °Medicines, such as: °Medicine taken by mouth to help you achieve an erection (oral medicine). °Hormone replacement therapy to replace low testosterone levels. °Medicine that is injected into the penis. Your health care provider may instruct you how to give yourself these injections at home. °Medicine that is delivered with a short applicator tube. The tube is inserted into the opening at the tip of the penis, which is the opening of the urethra. A tiny pellet of medicine is put in the urethra. The pellet dissolves and enhances erectile function. This is also called MUSE (medicated urethral system for erections) therapy. °Vacuum pump. This is a pump with a ring on it. The pump and ring are placed on the penis and used to create pressure that helps the penis become erect. °Penile implant surgery. In this procedure, you may receive: °An inflatable implant. This consists of cylinders, a pump, and a reservoir. The cylinders can be inflated with a fluid that helps to create an erection, and they can be deflated after intercourse. °A semi-rigid implant. This consists of two silicone rubber rods. The rods provide some rigidity. They are also flexible, so the penis can both curve downward in its normal position and become straight for sexual intercourse. °Blood vessel surgery to improve blood flow to the penis. During this procedure, a blood vessel from a different part of the body is placed into the penis to allow blood to flow around (bypass) damaged or blocked blood vessels. °Lifestyle changes,   such as exercising more, losing weight, and quitting smoking. °Follow  these instructions at home: °Medicines ° °Take over-the-counter and prescription medicines only as told by your health care provider. Do not increase the dosage without first discussing it with your health care provider. °If you are using self-injections, do injections as directed by your health care provider. Make sure you avoid any veins that are on the surface of the penis. After giving an injection, apply pressure to the injection site for 5 minutes. °Talk to your health care provider about how to prevent headaches while taking ED medicines. These medicines may cause a sudden headache due to the increase in blood flow in your body. °General instructions °Exercise regularly, as directed by your health care provider. Work with your health care provider to lose weight, if needed. °Do not use any products that contain nicotine or tobacco. These products include cigarettes, chewing tobacco, and vaping devices, such as e-cigarettes. If you need help quitting, ask your health care provider. °Before using a vacuum pump, read the instructions that come with the pump and discuss any questions with your health care provider. °Keep all follow-up visits. This is important. °Contact a health care provider if: °You feel nauseous. °You are vomiting. °You get sudden headaches while taking ED medicines. °You have any concerns about your sexual health. °Get help right away if: °You are taking oral or injectable medicines and you have an erection that lasts longer than 4 hours. If your health care provider is unavailable, go to the nearest emergency room for evaluation. An erection that lasts much longer than 4 hours can result in permanent damage to your penis. °You have severe pain in your groin or abdomen. °You develop redness or severe swelling of your penis. °You have redness spreading at your groin or lower abdomen. °You are unable to urinate. °You experience chest pain or a rapid heartbeat (palpitations) after taking oral  medicines. °These symptoms may represent a serious problem that is an emergency. Do not wait to see if the symptoms will go away. Get medical help right away. Call your local emergency services (911 in the [place] Do not drive yourself to the hospital. °Summary °Erectile dysfunction (ED) is the inability to get or keep an erection during sexual intercourse. °This condition is diagnosed based on a physical exam, your symptoms, and tests to determine the cause. Treatment varies depending on the cause and may include medicines, hormone therapy, surgery, or a vacuum pump. °You may need follow-up visits to make sure that you are using your medicines or devices correctly. °Get help right away if you are taking or injecting medicines and you have an erection that lasts longer than 4 hours. °This information is not intended to replace advice given to you by your health care provider. Make sure you discuss any questions you have with your health care provider. °Document Revised: 06/02/2020 Document Reviewed: 06/02/2020 °Elsevier Patient Education © 2022 [employer] ° °

## 2021-05-18 NOTE — Progress Notes (Signed)
? ?  05/18/21 ?10:39 AM  ? ?Kenneth Rodgers ?02-16-1986 ?[id number] ? ?CC: Erectile dysfunction ? ?HPI: ?36 year old male with type 1 diabetes and hypertension who presents with about a year of ED.  He has been using 100 mg sildenafil from his PCP with relatively good results, but notices some problems if he takes the medication with food.  He had a normal testosterone of 800 in the fall 2022.  He denies any urinary symptoms.  Recent hemoglobin A1c well controlled at 6.6. ? ? ?PMH: ?Past Medical History:  ?Diagnosis Date  ? Diabetes type I (HCC)   ? started on Tandem insulin pump 04/2020; has Dexcom  ? Diabetic retinopathy associated with type 1 diabetes mellitus (HCC)   ? Hyperlipidemia   ? Hypertension   ? Legally blind   ? 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 Sepsis (HCC) 2011  ? s/p tooth extraction and infected jaw  ? ? ?Surgical History: ?Past Surgical History:  ?Procedure Laterality Date  ? DENTAL SURGERY  2011  ? jaw infection (sepsis) from tooth extraction  ? SHOULDER ARTHROSCOPY WITH LABRAL REPAIR Left 06/01/2020  ? Procedure: LEFT SHOULDER ARTHROSCOPY WITH LABRAL REPAIR AND SUBACCROMINAL DECOMPRESSION;  Surgeon: Juanell Fairly, MD;  Location: [facility];  Service: Orthopedics;  Laterality: Left;  WITH BLOCK  ? ? ? ?Family History: ?Family History  ?Problem Relation Age of Onset  ? Pancreatic cancer Father   ? ? ?Social History:  reports that he has never smoked. He has never been exposed to tobacco smoke. He has never used smokeless tobacco. He reports that he does not currently use alcohol. He reports that he does not use drugs. ? ?Physical Exam: ?BP (!) 157/103   Pulse 89   Ht 6\' 2"  (1.88 m)   Wt 220 lb (99.8 kg)   BMI 28.25 kg/m?   ? ?Constitutional:  Alert and oriented, No acute distress. ?Cardiovascular: No clubbing, cyanosis, or edema. ?Respiratory: Normal respiratory effort, no increased work of breathing. ?GI: Abdomen is soft, nontender, nondistended, no abdominal masses ?GU: Phallus  with patent meatus, no lesions, testicles 20 cc and descended bilaterally.   ? ?Laboratory Data: ?Reviewed, see HPI ? ?Assessment & Plan:   ?36 year old male with type 1 diabetes and hypertension and erectile dysfunction.  We discussed the impact of diabetes, hypertension, and medications on his ED, and I recommended changing to Cialis 5 to 10 mg daily.  Side effect profile discussed at length.  We also reviewed the AUA guidelines regarding work-up of ED, and reassurance was provided regarding his normal testosterone level from just a few months ago. ? ?Trial of Cialis 5 to 10 mg daily ?He will contact 30 via MyChart/phone call if dose needs to be adjusted ?RTC 1 year-> if doing well at that time Cialis can likely be filled by PCP moving forward ? ? ?[place], MD ?05/18/2021 ? ?[facility] ?[address], [address] ?[place], [place] ?[phone] ? ? ?

## 2022-01-21 ENCOUNTER — Ambulatory Visit
Admission: EM | Admit: 2022-01-21 | Discharge: 2022-01-21 | Disposition: A | Discharge status: Home / Self Care | Payer: Medicare Other | Attending: Emergency Medicine | Admitting: Emergency Medicine

## 2022-01-21 ENCOUNTER — Ambulatory Visit (INDEPENDENT_AMBULATORY_CARE_PROVIDER_SITE_OTHER): Payer: Medicare Other

## 2022-01-21 DIAGNOSIS — R059 Cough, unspecified: Secondary | ICD-10-CM | POA: Diagnosis not present

## 2022-01-21 DIAGNOSIS — R051 Acute cough: Secondary | ICD-10-CM | POA: Diagnosis not present

## 2022-01-21 DIAGNOSIS — J069 Acute upper respiratory infection, unspecified: Secondary | ICD-10-CM

## 2022-01-21 MED ORDER — IPRATROPIUM BROMIDE 0.06 % NA SOLN: 2.0000 | Freq: Four times a day (QID) | NASAL | 12 refills | Status: AC

## 2022-01-21 MED ORDER — BENZONATATE 100 MG PO CAPS: 200.0000 mg | ORAL_CAPSULE | Freq: Three times a day (TID) | ORAL | 0 refills | Status: AC

## 2022-01-21 MED ORDER — PROMETHAZINE-DM 6.25-15 MG/5ML PO SYRP: 5.0000 mL | ORAL_SOLUTION | Freq: Four times a day (QID) | ORAL | 0 refills | Status: AC | PRN

## 2022-01-21 MED ORDER — AMOXICILLIN-POT CLAVULANATE 875-125 MG PO TABS: 1.0000 | ORAL_TABLET | Freq: Two times a day (BID) | ORAL | 0 refills | Status: AC

## 2022-01-21 NOTE — ED Triage Notes (Signed)
Patient reports that he has been having sinus congestion and dry cough and he thinks it is moving into the chest -- patient reports his sugar is running a little night.

## 2022-01-21 NOTE — Discharge Instructions (Addendum)
Your chest x-ray did not demonstrate any evidence of pneumonia.  I believe you have an upper respiratory infection and your drainage is what is triggering your cough.  Take the Augmentin twice daily with food for 10 days for treatment of your upper respiratory infection.  Use the Atrovent nasal spray, 2 squirts in each nostril every 6 hours, as needed for runny nose and postnasal drip.  Use the Tessalon Perles every 8 hours during the day.  Take them with a small sip of water.  They may give you some numbness to the base of your tongue or a metallic taste in your mouth, this is normal.  Use the Promethazine DM cough syrup at bedtime for cough and congestion.  It will make you drowsy so do not take it during the day.  Return for reevaluation or see your primary care provider for any new or worsening symptoms.

## 2022-01-21 NOTE — ED Provider Notes (Signed)
[facility]    CSN: [id number] Arrival date & time: 01/21/22  1102      History   Chief Complaint Chief Complaint  Patient presents with   Cough    Chest congestion/nasal congestion. No fever of other symptoms - Entered by patient    HPI Rakwon Letourneau is a 36 y.o. male.   HPI  36 year old male here for evaluation of respiratory complaints.  Patient reports that he has been experiencing symptoms for last 2 weeks that consist of runny nose with thick yellow discharge, sinus pressure, nonproductive cough that is worse at night, and eye pressure.  He denies any fever, ear pain, sore throat, shortness breath, or wheezing.  He is a type I diabetic and states that lately his sugars have been running in the 250s or higher.  Typically he is much better controlled and states his last A1c was 6.2.  He is on insulin pump and CGM.  He is concerned because last year at this time he had pneumonia and had a very similar presentation.  His other significant past medical history include hypertension, hyperlipidemia, sepsis, and diabetic retinopathy.  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   Past Surgical History:  Procedure Laterality Date   DENTAL SURGERY  2011   jaw infection (sepsis) from tooth extraction   SHOULDER ARTHROSCOPY WITH LABRAL REPAIR Left 06/01/2020   Procedure: LEFT SHOULDER ARTHROSCOPY WITH LABRAL REPAIR AND SUBACCROMINAL DECOMPRESSION;  Surgeon: Juanell Fairly, MD;  Location: [facility];  Service: Orthopedics;  Laterality: Left;  WITH BLOCK       Home Medications    Prior to Admission  medications   Medication Sig Start Date End Date Taking? Authorizing Provider  amoxicillin-clavulanate (AUGMENTIN) 875-125 MG tablet Take 1 tablet by mouth every 12 (twelve) hours for 10 days. 01/21/22 01/31/22 Yes Becky Augusta, NP  benzonatate (TESSALON) 100 MG capsule Take 2 capsules (200 mg total) by mouth every 8 (eight) hours. 01/21/22  Yes Becky Augusta, NP  ipratropium (ATROVENT) 0.06 % nasal spray Place 2 sprays into both nostrils 4 (four) times daily. 01/21/22  Yes Becky Augusta, NP  promethazine-dextromethorphan (PROMETHAZINE-DM) 6.25-15 MG/5ML syrup Take 5 mLs by mouth 4 (four) times daily as needed. 01/21/22  Yes Becky Augusta, NP  aspirin EC 81 MG tablet Take 81 mg by mouth daily. Swallow whole.    [provider]  atorvastatin (LIPITOR) 10 MG tablet Take 10 mg by mouth daily. 03/06/20   [provider]  cetirizine (ZYRTEC) 10 MG tablet Take 10 mg by mouth daily.    [provider]  citalopram (CELEXA) 10 MG tablet Take 10 mg by mouth daily. 05/10/21   [provider]  fluticasone (FLONASE) 50 MCG/ACT nasal spray Place 2 sprays into both nostrils daily as needed for allergies or rhinitis.    [provider]  hydrochlorothiazide (HYDRODIURIL) 25 MG tablet Take 25 mg by mouth daily. 05/10/21   [provider]  insulin lispro (HUMALOG) 100 UNIT/ML injection USE UP TO 120 UNITS IN PUMP DAILY 05/03/21   [provider]  losartan (COZAAR) 100 MG tablet Take 100 mg by mouth daily. 05/10/21   [provider]  magnesium gluconate (MAGONATE) 500 MG tablet Take 1,000 mg by mouth daily.    [provider]  Multiple Vitamin (MULTIVITAMIN WITH MINERALS) TABS tablet Take 1 tablet by mouth daily.    [provider]  pregabalin (LYRICA) 75 MG capsule Take 150 mg by mouth 2 (two) times daily. 05/12/21   [provider]  tadalafil (CIALIS) 5 MG tablet Take 1 tablet (5 mg total) by mouth daily as needed for erectile  dysfunction. 05/18/21   Billey Co, MD    Family History Family History  Problem Relation Age of Onset   Pancreatic cancer Father     Social History Social History   Tobacco Use   Smoking status: Never    Passive exposure: Never   Smokeless tobacco: Never  Vaping Use   Vaping Use: Never used  Substance Use Topics   Alcohol use: Not Currently   Drug use: Never     Allergies   Patient has no known allergies.   Review of Systems Review of Systems  Constitutional:  Negative for fever.  HENT:  Positive for congestion, rhinorrhea and sinus pressure. Negative for ear pain and sore throat.   Respiratory:  Positive for cough. Negative for shortness of breath and wheezing.      Physical Exam Triage Vital Signs ED Triage Vitals  Enc Vitals Group     BP 01/21/22 1139 (!) 173/109     Pulse Rate 01/21/22 1139 (!) 112     Resp --      Temp 01/21/22 1139 98.3 F (36.8 C)     Temp Source 01/21/22 1139 Oral     SpO2 01/21/22 1139 100 %     Weight 01/21/22 1138 230 lb (104.3 kg)     Height 01/21/22 1138 6\' 2"  (1.88 m)     Head Circumference --      Peak Flow --      Pain Score 01/21/22 1137 2     Pain Loc --      Pain Edu? --      Excl. in [place]? --    No data found.  Updated Vital Signs BP (!) 173/109 (BP Location: Left Arm)   Pulse (!) 112   Temp 98.3 F (36.8 C) (Oral)   Ht 6\' 2"  (1.88 m)   Wt 230 lb (104.3 kg)   SpO2 100%   BMI 29.53 kg/m   Visual Acuity Right Eye Distance:   Left Eye Distance:   Bilateral Distance:    Right Eye Near:   Left Eye Near:    Bilateral Near:     Physical Exam Vitals and nursing note reviewed.  Constitutional:      Appearance: Normal appearance. He is not ill-appearing.  HENT:     Head: Normocephalic and atraumatic.     Right Ear: Tympanic membrane, ear canal and external ear normal. There is no impacted cerumen.     Left Ear: Tympanic membrane, ear canal and external ear normal. There is no impacted cerumen.      Nose: Congestion and rhinorrhea present.     Comments: Nasal mucosa is erythematous edematous with thick yellow discharge in both nares.  Sinuses are nontender to percussion, both maxillary and frontal bilaterally.    Mouth/Throat:     Mouth: Mucous membranes are moist.     Pharynx: Oropharynx is clear. No oropharyngeal exudate or posterior  oropharyngeal erythema.  Eyes:     General: No scleral icterus.    Extraocular Movements: Extraocular movements intact.     Conjunctiva/sclera: Conjunctivae normal.     Pupils: Pupils are equal, round, and reactive to light.     Comments: Patient pupils are equal round reactive and EOMs intact.  No inflammation or injection of the bulbar labile conjunctiva bilaterally.  Discharge noted in either inner or outer canthus nor in the lashes.  Cardiovascular:     Rate and Rhythm: Normal rate and regular rhythm.     Pulses: Normal pulses.     Heart sounds: Normal heart sounds. No murmur heard.    No friction rub. No gallop.  Pulmonary:     Effort: Pulmonary effort is normal.     Breath sounds: Rhonchi present. No wheezing or rales.     Comments: Patient has coarse lung sounds in the right upper and middle lung fields.  No wheezes appreciated. Musculoskeletal:     Cervical back: Normal range of motion and neck supple.  Lymphadenopathy:     Cervical: No cervical adenopathy.  Skin:    General: Skin is warm and dry.     Capillary Refill: Capillary refill takes less than 2 seconds.     Findings: No erythema or rash.  Neurological:     General: No focal deficit present.     Mental Status: He is alert and oriented to person, place, and time.  Psychiatric:        Mood and Affect: Mood normal.        Behavior: Behavior normal.        Thought Content: Thought content normal.        Judgment: Judgment normal.      UC Treatments / Results  Labs (all labs ordered are listed, but only abnormal results are displayed) Labs Reviewed - No data to  display  EKG   Radiology DG Chest 2 View  Result Date: 01/21/2022 CLINICAL DATA:  Cough x2 weeks EXAM: CHEST - 2 VIEW COMPARISON:  Previous studies including the examination of 01/09/2021 FINDINGS: The heart size and mediastinal contours are within normal limits. Both lungs are clear. The visualized skeletal structures are unremarkable. IMPRESSION: No active cardiopulmonary disease. Electronically Signed   By: Ernie Avena M.D.   On: 01/21/2022 12:34    Procedures Procedures (including critical care time)  Medications Ordered in UC Medications - No data to display  Initial Impression / Assessment and Plan / UC Course  I have reviewed the triage vital signs and the nursing notes.  Pertinent labs & imaging results that were available during my care of the patient were reviewed by me and considered in my medical decision making (see chart for details).   Patient is a nontoxic-appearing 36 year old male here for evaluation of respiratory complaints as outlined in HPI above.  On exam he does have an upper respiratory infection based on the inflammation of his nasal passages and purulent discharge in both of his nares.  He also has coarse lung sounds which are concerning for pneumonia so I will obtain a chest x-ray to rule out the presence of pneumonia.  He is not having any respiratory distress at this time he is able to speak in full sentences without dyspnea or tachypnea.  Chest x-ray independently reviewed and evaluated by me.  Impression: The lung fields are well aerated.  No appreciable infiltrate or effusion noted.  Costophrenic angles are crisp.  Radiology overread is pending. Radiology impression states  no active cardiopulmonary disease.  I will discharge patient on the diagnosis of URI and cough.  Given that he has been running elevated blood sugars, has purulent discharge from his naris, and his symptoms have been going on for the past 2 weeks I will put him on a 10-day course  of 875 Augmentin twice daily with food.  I have also given management nasal spray to help with the congestion, Tessalon Perles help with cough during the day, and Promethazine DM cough syrup with cough and congestion at bedtime.  Return precautions reviewed.  Final Clinical Impressions(s) / UC Diagnoses   Final diagnoses:  Upper respiratory tract infection, unspecified type  Acute cough     Discharge Instructions      Your chest x-ray did not demonstrate any evidence of pneumonia.  I believe you have an upper respiratory infection and your drainage is what is triggering your cough.  Take the Augmentin twice daily with food for 10 days for treatment of your upper respiratory infection.  Use the Atrovent nasal spray, 2 squirts in each nostril every 6 hours, as needed for runny nose and postnasal drip.  Use the Tessalon Perles every 8 hours during the day.  Take them with a small sip of water.  They may give you some numbness to the base of your tongue or a metallic taste in your mouth, this is normal.  Use the Promethazine DM cough syrup at bedtime for cough and congestion.  It will make you drowsy so do not take it during the day.  Return for reevaluation or see your primary care provider for any new or worsening symptoms.      ED Prescriptions     Medication Sig Dispense Auth. Provider   amoxicillin-clavulanate (AUGMENTIN) 875-125 MG tablet Take 1 tablet by mouth every 12 (twelve) hours for 10 days. 20 tablet Becky Augusta, NP   benzonatate (TESSALON) 100 MG capsule Take 2 capsules (200 mg total) by mouth every 8 (eight) hours. 21 capsule Becky Augusta, NP   ipratropium (ATROVENT) 0.06 % nasal spray Place 2 sprays into both nostrils 4 (four) times daily. 15 mL Becky Augusta, NP   promethazine-dextromethorphan (PROMETHAZINE-DM) 6.25-15 MG/5ML syrup Take 5 mLs by mouth 4 (four) times daily as needed. 118 mL Becky Augusta, NP      PDMP not reviewed this encounter.   Becky Augusta,  NP 01/21/22 1252

## 2022-05-28 ENCOUNTER — Emergency Department
Admission: EM | Admit: 2022-05-28 | Discharge: 2022-05-28 | Disposition: A | Discharge status: Home / Self Care | Payer: Medicare Other | Attending: Emergency Medicine | Admitting: Emergency Medicine

## 2022-05-28 DIAGNOSIS — E162 Hypoglycemia, unspecified: Secondary | ICD-10-CM | POA: Diagnosis present

## 2022-05-28 DIAGNOSIS — I10 Essential (primary) hypertension: Secondary | ICD-10-CM | POA: Diagnosis not present

## 2022-05-28 DIAGNOSIS — E10649 Type 1 diabetes mellitus with hypoglycemia without coma: Secondary | ICD-10-CM | POA: Insufficient documentation

## 2022-05-28 LAB — CBC WITH DIFFERENTIAL/PLATELET
Abs Immature Granulocytes: 0.05 10*3/uL (ref 0.00–0.07)
Basophils Absolute: 0 10*3/uL (ref 0.0–0.1)
Basophils Relative: 1 %
Eosinophils Absolute: 0.2 10*3/uL (ref 0.0–0.5)
Eosinophils Relative: 2 %
HCT: 41.3 % (ref 39.0–52.0)
Hemoglobin: 13.7 g/dL (ref 13.0–17.0)
Immature Granulocytes: 1 %
Lymphocytes Relative: 17 %
Lymphs Abs: 1.5 10*3/uL (ref 0.7–4.0)
MCH: 28.3 pg (ref 26.0–34.0)
MCHC: 33.2 g/dL (ref 30.0–36.0)
MCV: 85.3 fL (ref 80.0–100.0)
Monocytes Absolute: 0.7 10*3/uL (ref 0.1–1.0)
Monocytes Relative: 9 %
Neutro Abs: 5.9 10*3/uL (ref 1.7–7.7)
Neutrophils Relative %: 70 %
Platelets: 339 10*3/uL (ref 150–400)
RBC: 4.84 MIL/uL (ref 4.22–5.81)
RDW: 12.8 % (ref 11.5–15.5)
WBC: 8.3 10*3/uL (ref 4.0–10.5)
nRBC: 0 % (ref 0.0–0.2)

## 2022-05-28 LAB — BASIC METABOLIC PANEL
Anion gap: 7 (ref 5–15)
BUN: 37 mg/dL — ABNORMAL HIGH (ref 6–20)
CO2: 19 mmol/L — ABNORMAL LOW (ref 22–32)
Calcium: 8.7 mg/dL — ABNORMAL LOW (ref 8.9–10.3)
Chloride: 113 mmol/L — ABNORMAL HIGH (ref 98–111)
Creatinine, Ser: 1.64 mg/dL — ABNORMAL HIGH (ref 0.61–1.24)
GFR, Estimated: 55 mL/min — ABNORMAL LOW (ref >60)
Glucose, Bld: 36 mg/dL — CL (ref 70–99)
Potassium: 3.1 mmol/L — ABNORMAL LOW (ref 3.5–5.1)
Sodium: 139 mmol/L (ref 135–145)

## 2022-05-28 LAB — CBG MONITORING, ED
Glucose-Capillary: 48 mg/dL — ABNORMAL LOW (ref 70–99)
Glucose-Capillary: 190 mg/dL — ABNORMAL HIGH (ref 70–99)

## 2022-05-28 MED ORDER — SODIUM CHLORIDE 0.9 % IV BOLUS
500.0000 mL | Freq: Once | INTRAVENOUS | Status: AC
  Administered 2022-05-28: 500 mL via INTRAVENOUS

## 2022-05-28 NOTE — ED Provider Notes (Addendum)
[facility] Provider Note    Event Date/Time   First MD Initiated Contact with Patient 05/28/22 1322     (approximate)   History   Chief Complaint: Hypoglycemia   HPI  Kenneth Rodgers is a 37 y.o. male with a history of hypertension, type 1 diabetes who was brought to the ED due to a witnessed seizure after church today.  Patient reports he woke up at 10:00, took her morning dose of insulin, but did not eat breakfast, and then went to church.  During church he started becoming confused according to his significant other at bedside.  He then had a loss of consciousness and seizure lasting about 10 seconds.  He was found to have low blood sugar and given sugar tablet prior to arrival in the ED.  Here in the ED, blood sugar was about 50 but mental status was normal so he was given a sandwich tray.  Patient denies any recent illness vomiting diarrhea fever.  No neck pain or headache.     Physical Exam   Triage Vital Signs: ED Triage Vitals  Enc Vitals Group     BP 05/28/22 1322 (!) 151/91     Pulse Rate 05/28/22 1322 (!) 123     Resp 05/28/22 1322 18     Temp --      Temp src --      SpO2 --      Weight --      Height --      Head Circumference --      Peak Flow --      Pain Score 05/28/22 1325 0     Pain Loc --      Pain Edu? --      Excl. in [place]? --     Most recent vital signs: Vitals:   05/28/22 1322  BP: (!) 151/91  Pulse: (!) 123  Resp: 18    General: Awake, no distress.  CV:  Good peripheral perfusion.  Tachycardia heart rate 110 Resp:  Normal effort.  Clear to auscultation Abd:  No distention.  Soft nontender Other:  Moist oral mucosa, normal mental status.  No significant trauma   ED Results / Procedures / Treatments   Labs (all labs ordered are listed, but only abnormal results are displayed) Labs Reviewed  BASIC METABOLIC PANEL - Abnormal; Notable for the following components:      Result Value   Potassium 3.1 (*)    Chloride  113 (*)    CO2 19 (*)    Glucose, Bld 36 (*)    BUN 37 (*)    Creatinine, Ser 1.64 (*)    Calcium 8.7 (*)    GFR, Estimated 55 (*)    All other components within normal limits  CBG MONITORING, ED - Abnormal; Notable for the following components:   Glucose-Capillary 48 (*)    All other components within normal limits  CBG MONITORING, ED - Abnormal; Notable for the following components:   Glucose-Capillary 190 (*)    All other components within normal limits  CBC WITH DIFFERENTIAL/PLATELET     EKG Interpreted by me Sinus tachycardia rate 122.  Normal axis, normal intervals.  Normal QRS ST segments and T waves.   RADIOLOGY    PROCEDURES:  Procedures   MEDICATIONS ORDERED IN ED: Medications  sodium chloride 0.9 % bolus 500 mL (0 mLs Intravenous Stopped 05/28/22 1452)     IMPRESSION / MDM / ASSESSMENT AND PLAN / ED COURSE  I reviewed the triage vital signs and the nursing notes.  DDx: Dehydration, electrolyte abnormality, anemia, metabolic acidosis  Patient's presentation is most consistent with acute presentation with potential threat to life or bodily function.  Patient presents with an episode of hypoglycemia causing a brief seizure at church.  Is most likely due to using insulin this morning but not eating.  Denies any preceding symptoms or recent illness.  No significant trauma.  Currently feeling back to normal and eating.  Will check labs, give IV fluids for hydration, recheck blood sugar.  If stable I think he can be discharged home.  ----------------------------------------- 2:54 PM on 05/28/2022 ----------------------------------------- Blood sugar 190.  Feeling at baseline, stable for discharge       FINAL CLINICAL IMPRESSION(S) / ED DIAGNOSES   Final diagnoses:  Type 1 diabetes mellitus with hypoglycemia and without coma ([facility])     Rx / DC Orders   ED Discharge Orders     None        Note:  This document was prepared using Dragon voice  recognition software and may include unintentional dictation errors.   Carrie Mew, MD 05/28/22 1421    Carrie Mew, MD 05/28/22 [phone]

## 2022-05-28 NOTE — Discharge Instructions (Addendum)
Your lab tests were okay today.  Continue monitoring your blood sugar reguarly.  Be sure to eat when taking your fast-acting insulin to avoid extreme drops in blood sugar.

## 2022-05-28 NOTE — ED Triage Notes (Signed)
Pt to ED from home following an incident of being altered after church. Pt had a glucose of 68 in the field and was given a 15 g of oral glucose. Pt has a hx of Type 1 diabetes and has misplaced his pump since this morning. Pt is A&O x4 at this time and denies N/V/headache. Pt glucose is currently 48.

## 2023-03-04 IMAGING — CR DG CHEST 2V
2 series · 2 of 2 positions shown · non-contrast
Comparison: None.

CLINICAL DATA: Chest pain and shortness of breath.  Fever.

EXAM:
CHEST - 2 VIEW

[chest pa]
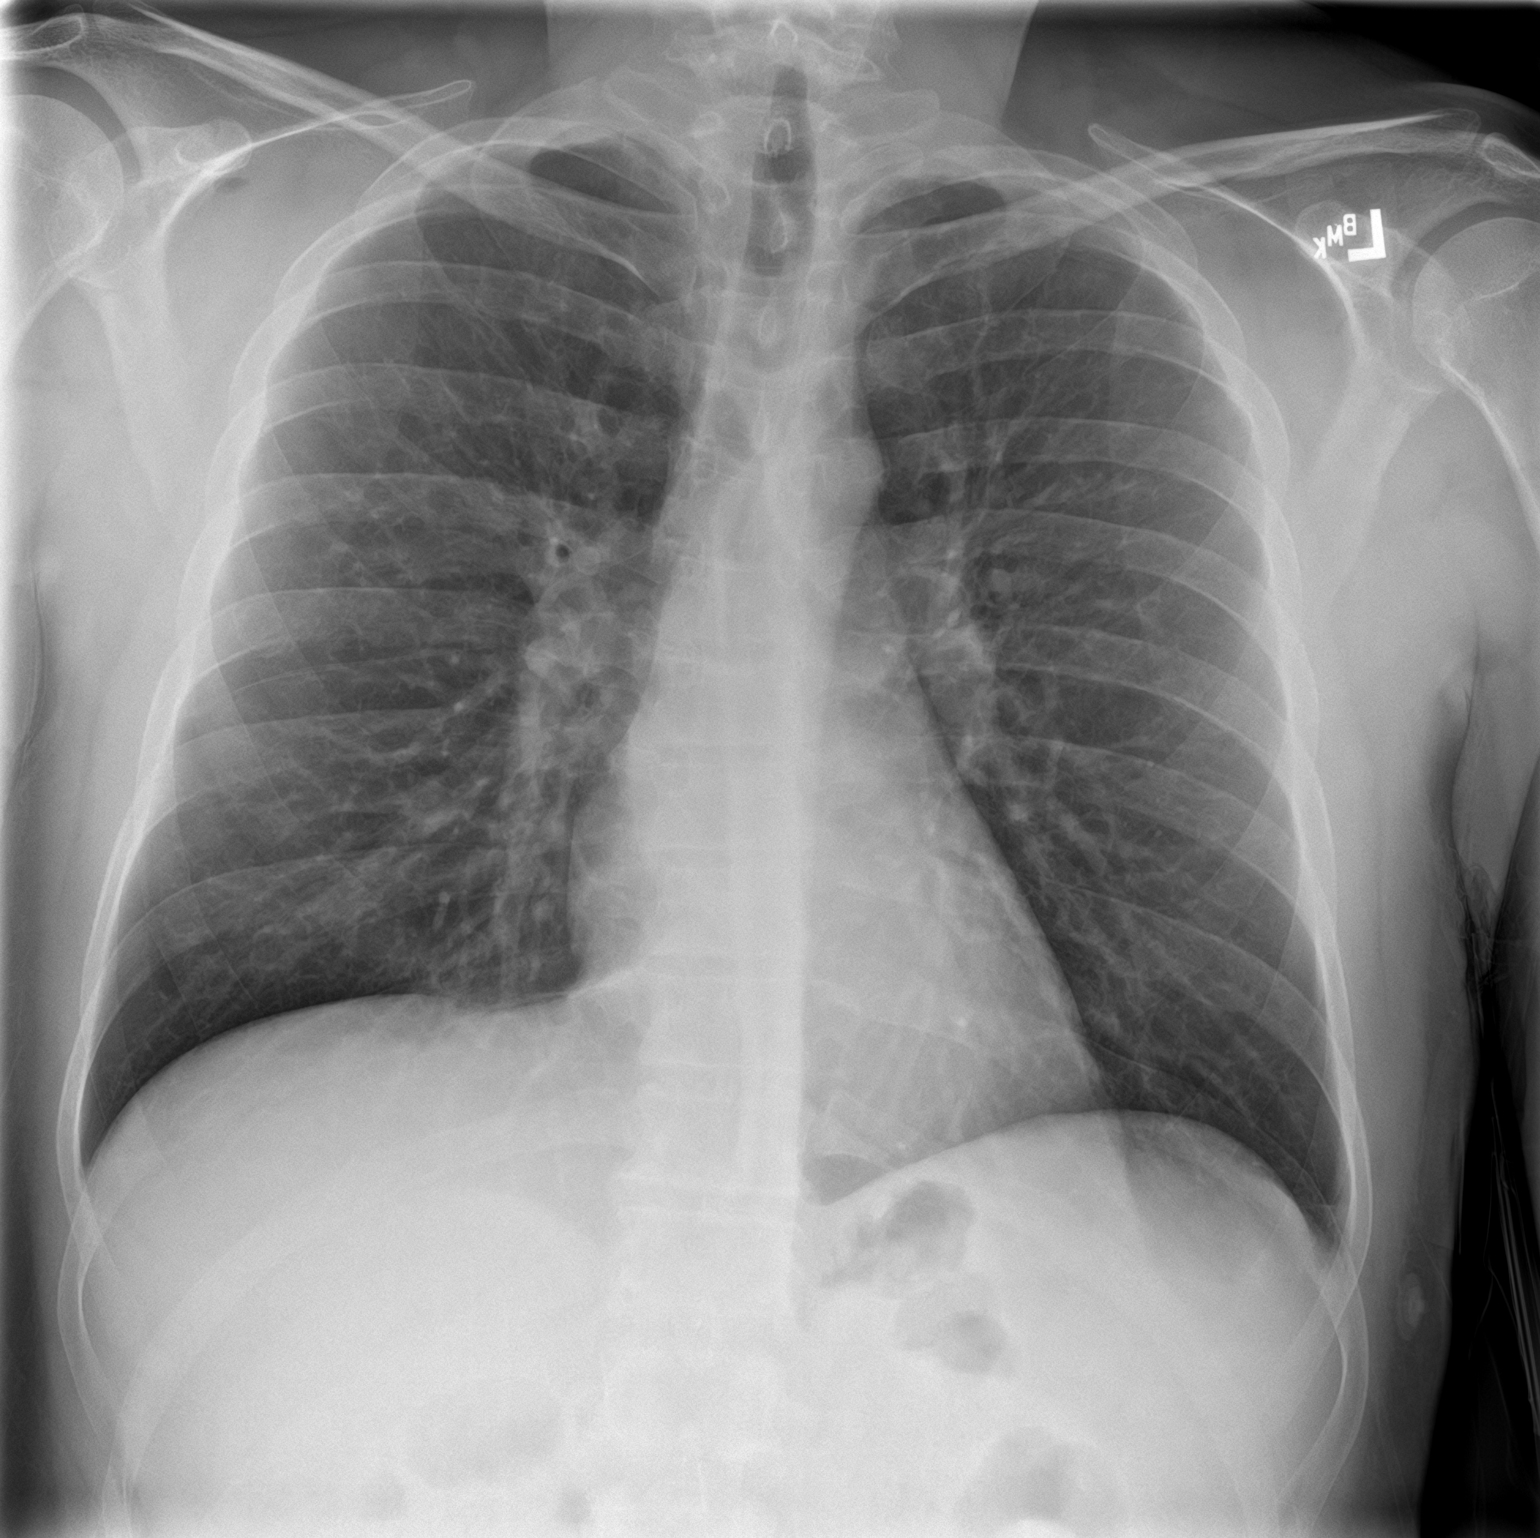

[chest lat]
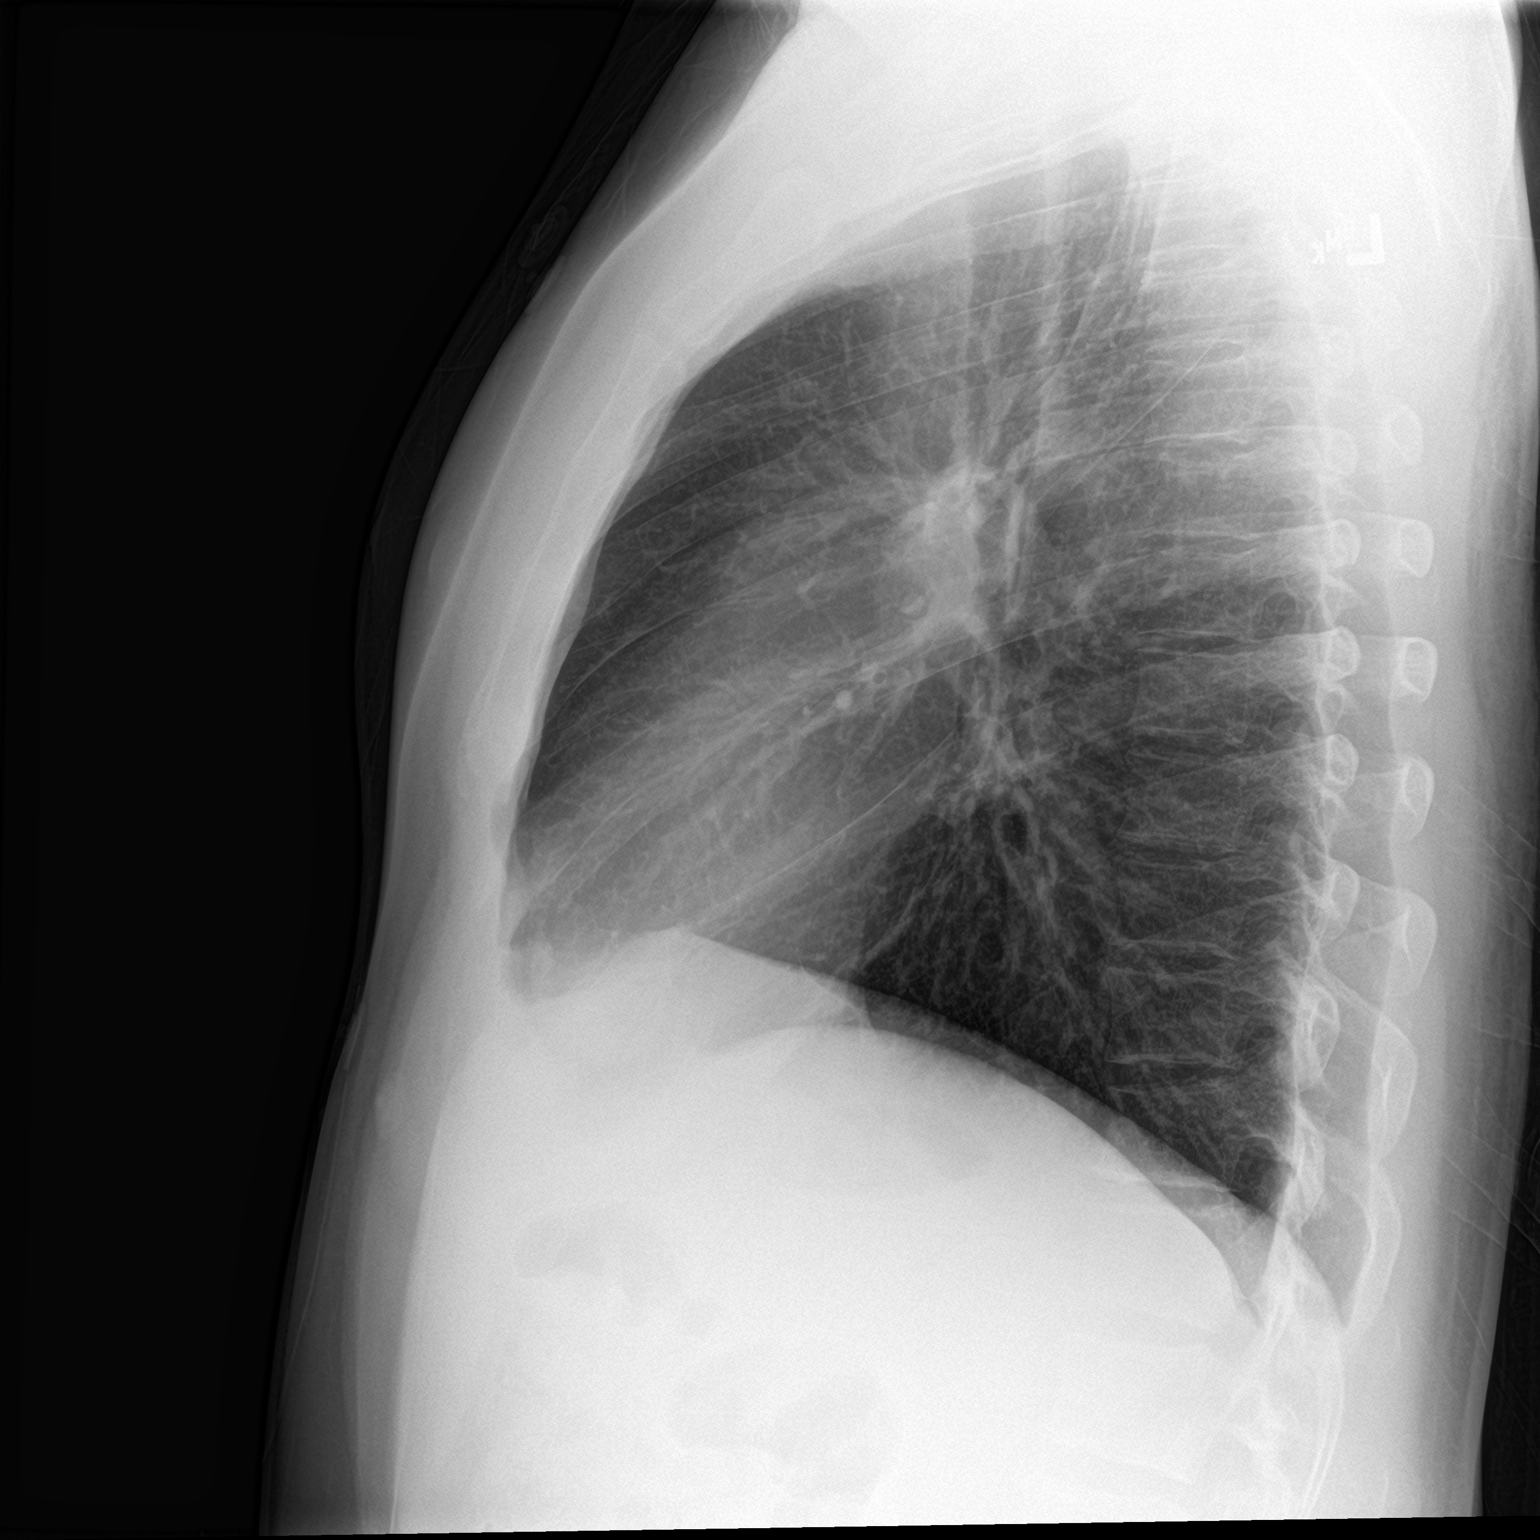

[2 of 2 positions shown; findings below may reference images not displayed]

FINDINGS: Normal heart size and mediastinal contours. There is no confluent
airspace disease. Vague interstitial prominence in the suprahilar
regions. No pulmonary edema, pleural effusion, or pneumothorax. No
acute osseous abnormalities are seen.
IMPRESSION: Vague interstitial prominence in the suprahilar regions may be
atelectasis or atypical infection.

## 2023-10-16 ENCOUNTER — Other Ambulatory Visit
Admission: RE | Admit: 2023-10-16 | Discharge: 2023-10-16 | Disposition: A | Discharge status: Home / Self Care | Source: Ambulatory Visit | Attending: Infectious Diseases | Admitting: Infectious Diseases

## 2023-10-16 DIAGNOSIS — R0602 Shortness of breath: Secondary | ICD-10-CM | POA: Insufficient documentation

## 2023-10-16 LAB — BRAIN NATRIURETIC PEPTIDE: B Natriuretic Peptide: 31.9 pg/mL (ref 0.0–100.0)

## 2023-10-17 ENCOUNTER — Encounter: Attending: Physician Assistant | Admitting: Physician Assistant

## 2023-10-17 DIAGNOSIS — E10621 Type 1 diabetes mellitus with foot ulcer: Secondary | ICD-10-CM | POA: Insufficient documentation

## 2023-10-17 DIAGNOSIS — X58XXXA Exposure to other specified factors, initial encounter: Secondary | ICD-10-CM | POA: Diagnosis not present

## 2023-10-17 DIAGNOSIS — L97522 Non-pressure chronic ulcer of other part of left foot with fat layer exposed: Secondary | ICD-10-CM | POA: Insufficient documentation

## 2023-10-17 DIAGNOSIS — I10 Essential (primary) hypertension: Secondary | ICD-10-CM | POA: Diagnosis not present

## 2023-10-17 DIAGNOSIS — T25222A Burn of second degree of left foot, initial encounter: Secondary | ICD-10-CM | POA: Diagnosis present

## 2023-10-17 DIAGNOSIS — T25221A Burn of second degree of right foot, initial encounter: Secondary | ICD-10-CM | POA: Insufficient documentation

## 2023-10-17 DIAGNOSIS — L97512 Non-pressure chronic ulcer of other part of right foot with fat layer exposed: Secondary | ICD-10-CM | POA: Diagnosis not present

## 2023-10-17 DIAGNOSIS — E1043 Type 1 diabetes mellitus with diabetic autonomic (poly)neuropathy: Secondary | ICD-10-CM | POA: Diagnosis not present

## 2023-10-25 ENCOUNTER — Encounter: Attending: Physician Assistant | Admitting: Physician Assistant

## 2023-10-25 DIAGNOSIS — E1043 Type 1 diabetes mellitus with diabetic autonomic (poly)neuropathy: Secondary | ICD-10-CM | POA: Insufficient documentation

## 2023-10-25 DIAGNOSIS — L97512 Non-pressure chronic ulcer of other part of right foot with fat layer exposed: Secondary | ICD-10-CM | POA: Diagnosis not present

## 2023-10-25 DIAGNOSIS — L97522 Non-pressure chronic ulcer of other part of left foot with fat layer exposed: Secondary | ICD-10-CM | POA: Diagnosis not present

## 2023-10-25 DIAGNOSIS — T25221A Burn of second degree of right foot, initial encounter: Secondary | ICD-10-CM | POA: Insufficient documentation

## 2023-10-25 DIAGNOSIS — T25222A Burn of second degree of left foot, initial encounter: Secondary | ICD-10-CM | POA: Diagnosis present

## 2023-10-25 DIAGNOSIS — X19XXXA Contact with other heat and hot substances, initial encounter: Secondary | ICD-10-CM | POA: Diagnosis not present

## 2023-10-25 DIAGNOSIS — E10621 Type 1 diabetes mellitus with foot ulcer: Secondary | ICD-10-CM | POA: Diagnosis not present

## 2023-10-31 ENCOUNTER — Encounter: Admitting: Physician Assistant

## 2023-10-31 DIAGNOSIS — T25222A Burn of second degree of left foot, initial encounter: Secondary | ICD-10-CM | POA: Diagnosis not present

## 2023-11-08 ENCOUNTER — Encounter: Admitting: Physician Assistant

## 2023-11-08 DIAGNOSIS — T25222A Burn of second degree of left foot, initial encounter: Secondary | ICD-10-CM | POA: Diagnosis not present

## 2023-11-13 ENCOUNTER — Ambulatory Visit: Admitting: Physician Assistant

## 2023-11-14 ENCOUNTER — Other Ambulatory Visit: Payer: Self-pay | Admitting: Physician Assistant

## 2023-11-14 ENCOUNTER — Ambulatory Visit
Admission: RE | Admit: 2023-11-14 | Discharge: 2023-11-14 | Disposition: A | Discharge status: Home / Self Care | Source: Ambulatory Visit | Attending: Physician Assistant | Admitting: Physician Assistant

## 2023-11-14 DIAGNOSIS — M869 Osteomyelitis, unspecified: Secondary | ICD-10-CM | POA: Insufficient documentation

## 2023-11-15 ENCOUNTER — Other Ambulatory Visit: Payer: Self-pay | Admitting: Internal Medicine

## 2023-11-15 ENCOUNTER — Encounter: Admitting: Internal Medicine

## 2023-11-15 DIAGNOSIS — T25222A Burn of second degree of left foot, initial encounter: Secondary | ICD-10-CM | POA: Diagnosis not present

## 2023-11-15 DIAGNOSIS — L97512 Non-pressure chronic ulcer of other part of right foot with fat layer exposed: Secondary | ICD-10-CM

## 2023-11-20 ENCOUNTER — Ambulatory Visit
Admission: RE | Admit: 2023-11-20 | Discharge: 2023-11-20 | Disposition: A | Discharge status: Home / Self Care | Source: Ambulatory Visit | Attending: Internal Medicine | Admitting: Internal Medicine

## 2023-11-20 DIAGNOSIS — L97512 Non-pressure chronic ulcer of other part of right foot with fat layer exposed: Secondary | ICD-10-CM | POA: Diagnosis present

## 2023-11-20 MED ORDER — GADOBUTROL 1 MMOL/ML IV SOLN
10.0000 mL | Freq: Once | INTRAVENOUS | Status: AC | PRN
  Administered 2023-11-20: 10 mL via INTRAVENOUS

## 2023-11-22 ENCOUNTER — Encounter: Attending: Physician Assistant | Admitting: Physician Assistant

## 2023-11-22 DIAGNOSIS — X19XXXA Contact with other heat and hot substances, initial encounter: Secondary | ICD-10-CM | POA: Insufficient documentation

## 2023-11-22 DIAGNOSIS — L97522 Non-pressure chronic ulcer of other part of left foot with fat layer exposed: Secondary | ICD-10-CM | POA: Insufficient documentation

## 2023-11-22 DIAGNOSIS — T25221A Burn of second degree of right foot, initial encounter: Secondary | ICD-10-CM | POA: Insufficient documentation

## 2023-11-22 DIAGNOSIS — T25222A Burn of second degree of left foot, initial encounter: Secondary | ICD-10-CM | POA: Insufficient documentation

## 2023-11-22 DIAGNOSIS — E1043 Type 1 diabetes mellitus with diabetic autonomic (poly)neuropathy: Secondary | ICD-10-CM | POA: Insufficient documentation

## 2023-11-22 DIAGNOSIS — I10 Essential (primary) hypertension: Secondary | ICD-10-CM | POA: Diagnosis not present

## 2023-11-22 DIAGNOSIS — L97512 Non-pressure chronic ulcer of other part of right foot with fat layer exposed: Secondary | ICD-10-CM | POA: Diagnosis not present

## 2023-11-22 DIAGNOSIS — E10621 Type 1 diabetes mellitus with foot ulcer: Secondary | ICD-10-CM | POA: Diagnosis present

## 2023-11-28 ENCOUNTER — Encounter: Admitting: Physician Assistant

## 2023-12-05 ENCOUNTER — Encounter: Admitting: Physician Assistant

## 2023-12-05 DIAGNOSIS — E10621 Type 1 diabetes mellitus with foot ulcer: Secondary | ICD-10-CM | POA: Diagnosis not present

## 2023-12-13 ENCOUNTER — Encounter: Admitting: Physician Assistant

## 2023-12-13 DIAGNOSIS — E10621 Type 1 diabetes mellitus with foot ulcer: Secondary | ICD-10-CM | POA: Diagnosis not present

## 2023-12-20 ENCOUNTER — Encounter: Attending: Physician Assistant | Admitting: Physician Assistant

## 2023-12-20 DIAGNOSIS — T25221A Burn of second degree of right foot, initial encounter: Secondary | ICD-10-CM | POA: Insufficient documentation

## 2023-12-20 DIAGNOSIS — E1043 Type 1 diabetes mellitus with diabetic autonomic (poly)neuropathy: Secondary | ICD-10-CM | POA: Diagnosis not present

## 2023-12-20 DIAGNOSIS — L97522 Non-pressure chronic ulcer of other part of left foot with fat layer exposed: Secondary | ICD-10-CM | POA: Diagnosis not present

## 2023-12-20 DIAGNOSIS — I10 Essential (primary) hypertension: Secondary | ICD-10-CM | POA: Diagnosis not present

## 2023-12-20 DIAGNOSIS — E10621 Type 1 diabetes mellitus with foot ulcer: Secondary | ICD-10-CM | POA: Diagnosis not present

## 2023-12-20 DIAGNOSIS — L97512 Non-pressure chronic ulcer of other part of right foot with fat layer exposed: Secondary | ICD-10-CM | POA: Diagnosis not present

## 2023-12-20 DIAGNOSIS — T25222A Burn of second degree of left foot, initial encounter: Secondary | ICD-10-CM | POA: Diagnosis present

## 2023-12-20 DIAGNOSIS — X58XXXA Exposure to other specified factors, initial encounter: Secondary | ICD-10-CM | POA: Diagnosis not present

## 2023-12-27 ENCOUNTER — Ambulatory Visit: Admitting: Physician Assistant

## 2024-01-02 ENCOUNTER — Encounter: Admitting: Physician Assistant

## 2024-01-02 DIAGNOSIS — T25222A Burn of second degree of left foot, initial encounter: Secondary | ICD-10-CM | POA: Diagnosis not present
